# Patient Record
Sex: Male | Born: 1940 | Race: White | Hispanic: No | Marital: Married | Smoking: Former smoker
Health system: Southern US, Community
[De-identification: ages and names within clinical notes are randomized; demographics above are authoritative.]

## PROBLEM LIST (undated history)

## (undated) DIAGNOSIS — I6529 Occlusion and stenosis of unspecified carotid artery: Secondary | ICD-10-CM

## (undated) DIAGNOSIS — Z86010 Personal history of colonic polyps: Secondary | ICD-10-CM

## (undated) DIAGNOSIS — J8410 Pulmonary fibrosis, unspecified: Secondary | ICD-10-CM

## (undated) DIAGNOSIS — R0602 Shortness of breath: Secondary | ICD-10-CM

## (undated) DIAGNOSIS — I771 Stricture of artery: Secondary | ICD-10-CM

## (undated) DIAGNOSIS — E785 Hyperlipidemia, unspecified: Secondary | ICD-10-CM

## (undated) DIAGNOSIS — M1990 Unspecified osteoarthritis, unspecified site: Secondary | ICD-10-CM

## (undated) DIAGNOSIS — Z860101 Personal history of adenomatous and serrated colon polyps: Secondary | ICD-10-CM

## (undated) DIAGNOSIS — I209 Angina pectoris, unspecified: Secondary | ICD-10-CM

## (undated) DIAGNOSIS — I10 Essential (primary) hypertension: Secondary | ICD-10-CM

## (undated) DIAGNOSIS — M489 Spondylopathy, unspecified: Secondary | ICD-10-CM

## (undated) DIAGNOSIS — F17200 Nicotine dependence, unspecified, uncomplicated: Secondary | ICD-10-CM

## (undated) DIAGNOSIS — N189 Chronic kidney disease, unspecified: Secondary | ICD-10-CM

## (undated) DIAGNOSIS — I2510 Atherosclerotic heart disease of native coronary artery without angina pectoris: Secondary | ICD-10-CM

## (undated) HISTORY — PX: CERVICAL DISC SURGERY: SHX588

## (undated) HISTORY — PX: APPENDECTOMY: SHX54

## (undated) HISTORY — DX: Personal history of colonic polyps: Z86.010

## (undated) HISTORY — DX: Stricture of artery: I77.1

## (undated) HISTORY — DX: Personal history of adenomatous and serrated colon polyps: Z86.0101

---

## 1968-11-19 HISTORY — PX: CYSTOSCOPY: SUR368

## 2001-11-06 ENCOUNTER — Ambulatory Visit (HOSPITAL_COMMUNITY): Admission: RE | Admit: 2001-11-06 | Discharge: 2001-11-06 | Payer: Self-pay | Admitting: Internal Medicine

## 2001-11-06 ENCOUNTER — Encounter: Payer: Self-pay | Admitting: Internal Medicine

## 2001-12-25 ENCOUNTER — Encounter: Payer: Self-pay | Admitting: Neurosurgery

## 2001-12-26 ENCOUNTER — Inpatient Hospital Stay (HOSPITAL_COMMUNITY): Admission: RE | Admit: 2001-12-26 | Discharge: 2001-12-27 | Payer: Self-pay | Admitting: Neurosurgery

## 2001-12-26 ENCOUNTER — Encounter: Payer: Self-pay | Admitting: Neurosurgery

## 2003-10-20 ENCOUNTER — Ambulatory Visit (HOSPITAL_COMMUNITY): Admission: RE | Admit: 2003-10-20 | Discharge: 2003-10-20 | Payer: Self-pay | Admitting: Internal Medicine

## 2003-10-20 HISTORY — PX: COLONOSCOPY: SHX174

## 2004-06-17 ENCOUNTER — Ambulatory Visit: Payer: Self-pay | Admitting: Orthopedic Surgery

## 2004-07-15 ENCOUNTER — Ambulatory Visit: Payer: Self-pay | Admitting: Orthopedic Surgery

## 2007-01-15 ENCOUNTER — Encounter: Payer: Self-pay | Admitting: Internal Medicine

## 2007-01-15 ENCOUNTER — Ambulatory Visit: Payer: Self-pay | Admitting: Internal Medicine

## 2007-01-15 ENCOUNTER — Ambulatory Visit (HOSPITAL_COMMUNITY): Admission: RE | Admit: 2007-01-15 | Discharge: 2007-01-15 | Payer: Self-pay | Admitting: Internal Medicine

## 2007-01-15 HISTORY — PX: COLONOSCOPY: SHX174

## 2007-10-16 DIAGNOSIS — Z860100 Personal history of colon polyps, unspecified: Secondary | ICD-10-CM | POA: Insufficient documentation

## 2010-08-03 NOTE — Op Note (Signed)
NAME:  Russell Patton, Russell Patton               ACCOUNT NO.:  [id number]   MEDICAL RECORD NO.:  [id number]          PATIENT TYPE:  AMB   LOCATION:  DAY                           FACILITY:  [facility]   PHYSICIAN:  R. Roetta Sessions, M.D. DATE OF BIRTH:  09/25/1940   DATE OF PROCEDURE:  01/15/2007  DATE OF DISCHARGE:                               OPERATIVE REPORT   INDICATIONS FOR PROCEDURE:  A 70 year old gentleman with a history  colonic adenomas.  Positive family history of colon cancer in a first  relative (father).  Last colonoscopy was 3 years ago at which time he  had polyp removed.  He is devoid of any lower GI tract symptoms.  Colonoscopy is now being done as a surveillance maneuver.  This approach  has discussed with the patient at length.  Potential risks, benefits,  and alternatives have been reviewed.  Please see the documentation in  the medical record.   PROCEDURE NOTE:  O2 saturation, blood pressure, pulse, and respirations  monitored throughout the entirety of the procedure.   CONSCIOUS SEDATION:  Versed 3 mg IV, Demerol 75 mg IV in divided doses.   INSTRUMENT:  Pentax video chip system.   FINDINGS:  Digital rectal exam revealed no abnormalities.   ENDOSCOPIC FINDINGS:  The prep was adequate.   COLON:  The colonic mucosa was surveyed from the rectosigmoid junction  through the left transverse and right colon to the appendiceal orifice,  ileocecal valve, and cecum.  These structures were well seen and  photographed for the record.   From this level scope was withdrawn, slowly and cautiously, all  previously mentioned mucosal surfaces were, again, seen taking taken  care to review the mucosa carefully with a combination of tip-flexion  and fold-flattening.  The patient had a pan colonic diverticula and a 5  mm polyp on a stalk in the base the cecum which was cold snared.  There  was an adjacent diminutive polyp which was cold biopsied/removed.  In  the mid descending colon there  was a second 5-mm pedunculated polyp  which was removed via cold snare technique.  The remainder of the  colonic mucosa appeared normal.   Scope was pulled down to the rectum.  A thorough examination of the  rectal mucosa including retroflex view of the anal verge demonstrated no  abnormalities.  The patient tolerated the procedure well, and was  reacted in endoscopy.   IMPRESSION:  1. Normal rectum.  2. Pan colonic diverticula.  3. Cecal and descending colon polyps removed as described above.   RECOMMENDATIONS:  1. Diverticulosis literature provided [name].  2. Follow up on path.  3. Further recommendations to follow.      Jonathon Bellows, M.D.  Electronically Signed     RMR/MEDQ  D:  01/15/2007  T:  01/15/2007  Job:  [id number]   cc:   Madelin Rear. Sherwood Gambler, MD  Fax: [phone]

## 2010-08-06 NOTE — Op Note (Signed)
NAME:  Russell Patton, PROUT NO.:  [id number]   MEDICAL RECORD NO.:  [id number]                   PATIENT TYPE:  INP   LOCATION:  3009                                 FACILITY:  [facility]   PHYSICIAN:  Clydene Fake, M.D.               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   POSTOPERATIVE DIAGNOSES:  Spondylosis, left foraminal stenosis  C5-C6 and C6-  C7 with left-sided radiculopathy.   PROCEDURE:  Anterior cervical decompression diskectomy and fusion at C5-C6  and C6-C7 with allograft and allograft and tethered anterior cervical plate.   ANESTHESIA:  General endotracheal tube was performed.   DRAINS:  None.   COMPLICATIONS:  None.   SURGEON:  Clydene Fake, M.D.   ASSISTANT:  Payton Doughty, M.D.   REASON FOR PROCEDURE:  The patient is a 70 year old gentleman who for 2-3  months had considerable neck and left arm pain in the C7 distribution with  some numbness in the C6, C7, and C8 distributions and also decreased reflex  in the left biceps and triceps on the left compared to the right side.  There was severe spondylosis at C5-C6 and C6-C7 and left-sided foraminal  compression and narrowing on the left side.  The patient was tried on anti-  inflammatories and did not have improvement and consented for decompression  and fusion.   PROCEDURE IN DETAIL:  The patient was brought into the operating room.  General anesthesia was induced.  The patient was placed in Holter traction  with 10 pounds, prepped and draped in the usual sterile fashion.  Incision  was injected with 10 cc of 1% lidocaine with epinephrine.  Incision was then  made from the midline to the anterior border of the sternocleidomastoid  muscle, and the left lateral neck  incision taken down to the platysma where  hemostasis was obtained with Bovie cauterization.  The Bovie was used to  incise the platysma, and blunt dissection was used to dissect the anterior  cervical fascia to the anterior cervical spine, where a needle was placed in  the interspace.  X-rays were obtained confirming the C5-C6 interspace.  Disk  space was incised.  The needle was removed and partial diskectomy was  performed.  The longus colli muscle was then reflected laterally on each  side using the Bovie.  A self-retaining retractor system was placed at the  C5-C6 and C6-C7 interspaces.  Distraction pins were placed into C5 and C7.  The disk spaces at C5-C6 and C6-C7 with the Ginsu blade, and then the  interspace was distracted.  At this point, the microscope was brought in for  microdissection.  Diskectomy was performed using pituitary rongeurs,  curettes,  and the high-speed drill.  The decompression of central cord and  foramen was continued by removing the posterior longitudinal ligaments and  large osteophytes and performing again bilateral foraminotomies again using  Kerrison punches and the high-speed drill.  First, we finished this at C5-C6  and did extensive decompression out laterally on the left side to adequately  decompress the roots.  Hemostasis was obtained with Gelfoam and Thrombin,  after the depth of the vertebral body was measured with department gauge.  The decompression was repeated at the C6-C7 level again using curettes and  Kerrison punches.  Again, we had to decompress central cord and then  bilateral nerve roots on the left side where we did a fairly lateral  decompression to be able to decompress the roots.  Using the high-speed  drill, we removed the cartilaginous end plate down to bleeding bone.  A  ________ 7-mm allograft bone was hydrated.  The bone was already shorter  than the depth of the vertebral body, and so it was tapped into the  interspace and  countersunk 1-2 mm.  There was plenty of room between the  back of the bone and the dura as checked with a bone nerve hook.  This was  first done at C5-C6 and then repeated at C6-C7.  The distractor was removed.  Distraction pins were removed, and hemostasis at the bone edges was obtained  with Gelfoam and thrombin.  A tethered anterior  cervical plate was then  placed over the anterior cervical spine and two screws placed into C5, two  into C7, and one into C6.  X-rays were obtained showing good position of  plates, screws, and bone grafts.  The wound was irrigated with antibiotic  solution.  Hemostasis was obtained with bipolar cauterization, Gelfoam, and  Thrombin.  We did have some bleeding.  It did take some time to stop the  bleeding, and we found a couple of small bleeders that were bipolared, and  with Gelfoam and Thrombin in the interbody space of C5-C6, we were able to  slow down the bleeding.  Once that was done, we did irrigate the Gelfoam out  to make sure there was no cord compression.  The wound was then irrigated,  and there was clear fluid.  We were sure we had good hemostasis.  We then  laid some Surgicel over the longus colli muscle edges, and then closed the  platysma with 3-0 Vicryl interrupted sutures, closed the subcutaneous tissue  with the same, and closed the skin with Benzoin and Steri-Strips.  Dressing  was placed.  The patient was placed in a soft sterile collar, awoken from  anesthesia, and transferred to the recovery room in stable condition.                                                Clydene Fake, M.D.    JRH/MEDQ  D:  12/26/2001  T:  12/27/2001  Job:  [id number]

## 2010-08-06 NOTE — Op Note (Signed)
NAME:  Russell Patton, Russell Patton                         ACCOUNT NO.:  [id number]   MEDICAL RECORD NO.:  [id number]                   PATIENT TYPE:  AMB   LOCATION:  DAY                                  FACILITY:  [facility]   PHYSICIAN:  R. Roetta Sessions, M.D.              DATE OF BIRTH:  1940/12/28   DATE OF PROCEDURE:  10/20/2003  DATE OF DISCHARGE:                                 OPERATIVE REPORT   PROCEDURE:  Colonoscopy with snare polypectomy.   INDICATIONS FOR PROCEDURE:  The patient is a 70 year old gentleman referred  by Dr. Sherwood Gambler for colorectal cancer screening.  Family history is significant  in his mother who succumbed to colorectal cancer at age 71.  He has never  had his lower GI tract imaged.  Colonoscopy is now being done.  This  approach has been discussed with the patient at length.  Potential risks and  benefits have been reviewed.  Please see my handwritten H&P for more  information.   PROCEDURE NOTE:  O2 saturation, blood pressure, pulse and respirations were  monitored and satisfactory throughout the procedure.   CONSCIOUS SEDATION:  Versed 4 mg IV, Demerol 75 mg IV  in divided doses.   INSTRUMENT:  Olympus video chip system.   FINDINGS:  Digital rectal examination revealed no abnormality.   ENDOSCOPIC FINDINGS:  Prep was adequate.  Rectum:  Examination of the rectal  mucosa was clear.  Retroflexion of the anal verge revealed no abnormalities.  Colon:  The colonic mucosa was surveyed from the rectosigmoid junction  through a left, transverse, right colon to the area of the appendiceal  orifice and ileocecal valve and cecum.  From this level, the scope was  slowly withdrawn.  All previously mentioned mucosal surfaces were again  seen.  The patient was noted to have sigmoid diverticula and two 0.75 cm  polyps, one opposite the ileocecal valve and one at the splenic flexure  which were cold snared and recovered.   The remainder of the colonic mucosa appeared normal.  The  patient tolerated  the procedure well, was reactive at endoscopy.   IMPRESSION:  1. Normal rectum.  2. Sigmoid diverticula.  3. Polyps at splenic flexure and at the cecum, (opposite ileocecal valve),     removed with a snare.   RECOMMENDATIONS:  1. No aspirin or arthritis medications for the next 10 days.  2. Follow up on pathology.  3. Diverticulosis literature given to Mr. Macleod.  4. Further recommendations to follow.      ___________________________________________                                            Jonathon Bellows, M.D.   RMR/MEDQ  D:  10/20/2003  T:  10/20/2003  Job:  [id number]   cc:   Lyman Bishop  J. Sherwood Gambler, M.D.  [id number]  [place]  [place] [address]  Fax: [phone]

## 2011-01-27 ENCOUNTER — Other Ambulatory Visit: Payer: Self-pay

## 2011-01-27 ENCOUNTER — Encounter (HOSPITAL_COMMUNITY): Payer: Self-pay | Admitting: Emergency Medicine

## 2011-01-27 ENCOUNTER — Inpatient Hospital Stay (HOSPITAL_COMMUNITY)
Admission: EM | Admit: 2011-01-27 | Discharge: 2011-02-03 | DRG: 233 | Disposition: A | Discharge status: Home / Self Care | Payer: Medicare Other | Source: Ambulatory Visit | Attending: Cardiothoracic Surgery | Admitting: Cardiothoracic Surgery

## 2011-01-27 DIAGNOSIS — Z87891 Personal history of nicotine dependence: Secondary | ICD-10-CM

## 2011-01-27 DIAGNOSIS — I498 Other specified cardiac arrhythmias: Secondary | ICD-10-CM | POA: Diagnosis present

## 2011-01-27 DIAGNOSIS — D649 Anemia, unspecified: Secondary | ICD-10-CM | POA: Diagnosis not present

## 2011-01-27 DIAGNOSIS — R0602 Shortness of breath: Secondary | ICD-10-CM

## 2011-01-27 DIAGNOSIS — I658 Occlusion and stenosis of other precerebral arteries: Secondary | ICD-10-CM | POA: Diagnosis present

## 2011-01-27 DIAGNOSIS — I10 Essential (primary) hypertension: Secondary | ICD-10-CM | POA: Diagnosis present

## 2011-01-27 DIAGNOSIS — Z951 Presence of aortocoronary bypass graft: Secondary | ICD-10-CM

## 2011-01-27 DIAGNOSIS — E8779 Other fluid overload: Secondary | ICD-10-CM | POA: Diagnosis not present

## 2011-01-27 DIAGNOSIS — E785 Hyperlipidemia, unspecified: Secondary | ICD-10-CM | POA: Diagnosis present

## 2011-01-27 DIAGNOSIS — I2510 Atherosclerotic heart disease of native coronary artery without angina pectoris: Principal | ICD-10-CM | POA: Diagnosis present

## 2011-01-27 DIAGNOSIS — D72829 Elevated white blood cell count, unspecified: Secondary | ICD-10-CM | POA: Diagnosis not present

## 2011-01-27 DIAGNOSIS — R001 Bradycardia, unspecified: Secondary | ICD-10-CM | POA: Diagnosis not present

## 2011-01-27 DIAGNOSIS — J180 Bronchopneumonia, unspecified organism: Secondary | ICD-10-CM | POA: Diagnosis not present

## 2011-01-27 DIAGNOSIS — Z8249 Family history of ischemic heart disease and other diseases of the circulatory system: Secondary | ICD-10-CM

## 2011-01-27 DIAGNOSIS — I6529 Occlusion and stenosis of unspecified carotid artery: Secondary | ICD-10-CM | POA: Diagnosis present

## 2011-01-27 DIAGNOSIS — Z7982 Long term (current) use of aspirin: Secondary | ICD-10-CM

## 2011-01-27 DIAGNOSIS — I200 Unstable angina: Secondary | ICD-10-CM | POA: Diagnosis present

## 2011-01-27 DIAGNOSIS — M489 Spondylopathy, unspecified: Secondary | ICD-10-CM

## 2011-01-27 HISTORY — DX: Spondylopathy, unspecified: M48.9

## 2011-01-27 HISTORY — DX: Atherosclerotic heart disease of native coronary artery without angina pectoris: I25.10

## 2011-01-27 HISTORY — DX: Unspecified osteoarthritis, unspecified site: M19.90

## 2011-01-27 HISTORY — DX: Occlusion and stenosis of unspecified carotid artery: I65.29

## 2011-01-27 HISTORY — DX: Essential (primary) hypertension: I10

## 2011-01-27 HISTORY — DX: Nicotine dependence, unspecified, uncomplicated: F17.200

## 2011-01-27 HISTORY — DX: Hyperlipidemia, unspecified: E78.5

## 2011-01-27 LAB — BASIC METABOLIC PANEL
BUN: 14 mg/dL (ref 6–23)
CO2: 24 mEq/L (ref 19–32)
Calcium: 8.9 mg/dL (ref 8.4–10.5)
Creatinine, Ser: 0.99 mg/dL (ref 0.50–1.35)
GFR calc non Af Amer: 80 mL/min — ABNORMAL LOW (ref >90)
Glucose, Bld: 102 mg/dL — ABNORMAL HIGH (ref 70–99)
Sodium: 135 mEq/L (ref 135–145)

## 2011-01-27 LAB — DIFFERENTIAL
Eosinophils Absolute: 0.1 10*3/uL (ref 0.0–0.7)
Eosinophils Relative: 1 % (ref 0–5)
Lymphs Abs: 1.7 10*3/uL (ref 0.7–4.0)
Monocytes Absolute: 0.6 10*3/uL (ref 0.1–1.0)
Monocytes Relative: 6 % (ref 3–12)

## 2011-01-27 LAB — CBC
HCT: 43.9 % (ref 39.0–52.0)
MCH: 32 pg (ref 26.0–34.0)
MCV: 90.1 fL (ref 78.0–100.0)
Platelets: 186 10*3/uL (ref 150–400)
RBC: 4.87 MIL/uL (ref 4.22–5.81)

## 2011-01-27 MED ORDER — ASPIRIN 81 MG PO CHEW
324.0000 mg | CHEWABLE_TABLET | Freq: Once | ORAL | Status: AC
  Administered 2011-01-27: 324 mg via ORAL
  Filled 2011-01-27: qty 4

## 2011-01-27 MED ORDER — HEPARIN BOLUS VIA INFUSION
4000.0000 [IU] | Freq: Once | INTRAVENOUS | Status: AC
  Administered 2011-01-27: 4000 [IU] via INTRAVENOUS
  Filled 2011-01-27: qty 4000

## 2011-01-27 MED ORDER — SODIUM CHLORIDE 0.9 % IV SOLN: INTRAVENOUS | Status: DC

## 2011-01-27 MED ORDER — HEPARIN (PORCINE) IN NACL 100-0.45 UNIT/ML-% IJ SOLN
1400.0000 [IU]/h | INTRAMUSCULAR | Status: DC
  Administered 2011-01-27: 1000 [IU]/h via INTRAVENOUS
  Administered 2011-01-28: 1400 [IU]/h via INTRAVENOUS
  Filled 2011-01-27 (×3): qty 250

## 2011-01-27 NOTE — ED Notes (Signed)
Pt. Having an exercise stress test and developed ECg changes and sob.  Stopped the exercise test and began the chemical stress test and pt. Developed hypotension and bradycardia from the medication.  That test also was stopped and pt. Sent to [place] to be cathed this afternoon.   Pt. Denies any chest pain or sob.  Skin is p/w/d. Resp. E/u

## 2011-01-27 NOTE — ED Notes (Signed)
Pt. oob to bathroom gait steady denies any sob or chest pain. Wife at bedside

## 2011-01-27 NOTE — H&P (Signed)
Russell Patton is an 70 y.o. male.   Chief Complaint: Patient stated he felt short of breath with stress test and really bad with Lexi scan Myoview. HPI: 70 year old white, married male was sent to the emergency room from [facility] where he was attempting to undergo exercise stress test. During the stress portion patient became significantly dyspneic with any exertion. Dr. Rennis Golden was present for the test, which was changed to Lexi scan myoview. While undergoing Lexi scan myoview, patient developed significant bradycardia in the 30s and was symptomatic with hypotension and near syncope. Additionally patient had significant ST depression on the EKG portion of the stress test in leads 1, 2, 3, aVF, V4, V5, and V6. He had ST elevation in lead aVR and V1. EKG changes resolved in recovery. The nuclear portion of the study did not reveal ischemia. Dr. Blanchie Dessert concern is that patient has balanced ischemia and needs cardiac catheterization to determine reason for the EKG changes and that severe symptoms he had with a stress tests.  Here in the emergency room the patient has no pain, no shortness of breath, and only occasional PVCs though some are paired and on the monitor. Dr. Blanchie Dessert plan is to admit the patient plan proceed with cardiac catheterization 01/28/2011.  Patient was originally seen by Dr. Rennis Golden 01/11/2011 for evaluation concerning unequal blood pressures and left and right arms as well as dyslipidemia. Additionally patient had a 2-D echo which was normal and carotid Dopplers which demonstrated a severe occlusion with increased velocity on the right and a mild occlusion on the flow velocities on the left.   Past Medical History  Diagnosis Date  . Hypertension   . Carotid artery stenosis   . Hyperlipidemia   . Coronary artery disease   . Cervical spine disease 01/27/2011    Past Surgical History  Procedure Date  . Appendectomy   . Cervical disc surgery     Family  History  Problem Relation Age of Onset  . Cancer Mother   . Heart attack Father    Social History:  reports that he quit smoking about 9 years ago. His smoking use included Cigarettes. He does not have any smokeless tobacco history on file. He reports that he drinks alcohol. He reports that he does not use illicit drugs.  Allergies: No Known Allergies  Medications Prior to Admission  Medication Dose Route Frequency Provider Last Rate Last Dose  . aspirin chewable tablet 324 mg  324 mg Oral Once Otilio Miu, PA   324 mg at 01/27/11 1114   No current outpatient prescriptions on file as of 01/27/2011.    Results for orders placed during the hospital encounter of 01/27/11 (from the past 48 hour(s))  CBC     Status: Normal   Collection Time   01/27/11 10:31 AM      Component Value Range Comment   WBC 10.4  4.0 - 10.5 (K/uL)    RBC 4.87  4.22 - 5.81 (MIL/uL)    Hemoglobin 15.6  13.0 - 17.0 (g/dL)    HCT 40.9  81.1 - 91.4 (%)    MCV 90.1  78.0 - 100.0 (fL)    MCH 32.0  26.0 - 34.0 (pg)    MCHC 35.5  30.0 - 36.0 (g/dL)    RDW 78.2  95.6 - 21.3 (%)    Platelets 186  150 - 400 (K/uL)   DIFFERENTIAL     Status: Abnormal   Collection Time   01/27/11 10:31  AM      Component Value Range Comment   Neutrophils Relative 77  43 - 77 (%)    Neutro Abs 8.1 (*) 1.7 - 7.7 (K/uL)    Lymphocytes Relative 16  12 - 46 (%)    Lymphs Abs 1.7  0.7 - 4.0 (K/uL)    Monocytes Relative 6  3 - 12 (%)    Monocytes Absolute 0.6  0.1 - 1.0 (K/uL)    Eosinophils Relative 1  0 - 5 (%)    Eosinophils Absolute 0.1  0.0 - 0.7 (K/uL)    Basophils Relative 0  0 - 1 (%)    Basophils Absolute 0.0  0.0 - 0.1 (K/uL)   BASIC METABOLIC PANEL     Status: Abnormal   Collection Time   01/27/11 10:31 AM      Component Value Range Comment   Sodium 135  135 - 145 (mEq/L)    Potassium 4.1  3.5 - 5.1 (mEq/L)    Chloride 103  96 - 112 (mEq/L)    CO2 24  19 - 32 (mEq/L)    Glucose, Bld 102 (*) 70 - 99 (mg/dL)    BUN  14  6 - 23 (mg/dL)    Creatinine, Ser 1.61  0.50 - 1.35 (mg/dL)    Calcium 8.9  8.4 - 10.5 (mg/dL)    GFR calc non Af Amer 80 (*) >90 (mL/min)    GFR calc Af Amer >90  >90 (mL/min)   TROPONIN I     Status: Normal   Collection Time   01/27/11 10:35 AM      Component Value Range Comment   Troponin I <0.30  <0.30 (ng/mL)   CK TOTAL AND CKMB     Status: Normal   Collection Time   01/27/11 10:35 AM      Component Value Range Comment   Total CK 81  7 - 232 (U/L)    CK, MB 2.6  0.3 - 4.0 (ng/mL)    Relative Index RELATIVE INDEX IS INVALID  0.0 - 2.5     No results found.  Review of Systems  Constitutional: Negative for fever, chills and weight loss.  HENT: Positive for neck pain. Negative for congestion.   Eyes: Negative for blurred vision.  Respiratory: Positive for shortness of breath.   Cardiovascular: Negative for chest pain.  Gastrointestinal: Negative for heartburn, nausea, vomiting, diarrhea, constipation and blood in stool.  Genitourinary: Negative for dysuria and urgency.  Musculoskeletal: Negative for myalgias.  Skin: Negative for rash.  Neurological: Negative for dizziness, focal weakness, loss of consciousness and weakness.  Endo/Heme/Allergies: Negative for polydipsia. Does not bruise/bleed easily.  Psychiatric/Behavioral: Negative for depression.    Blood pressure 131/75, pulse 74, temperature 98.1 F (36.7 C), temperature source Oral, resp. rate 16, SpO2 95.00%. Physical Exam General: Alert oriented white male, no acute distress. Pleasant affect. Skin: Warm and dry, brisk capillary refill. HEENT: Normocephalic, sclera clear. Neck: Supple, no JVD, no bruits detected. Lungs: Clear without rales, rhonchi or wheezes. Heart: S1-S2,regular, rate and rhythm without murmur ,gallop, rub or click. Abdomen: Soft nontender positive bowel sounds do not palpate liver spleen or masses. Extremities:2+ pedal pulses bilaterally ,no lower extremity edema, 2+ radial pulses  bilaterally. Neuro: Alert oriented x3, moves all extremities, follows commands.  Assessment/Plan Patient Active Problem List  Diagnoses  . COLONIC POLYPS, HX OF  . DOE (dyspnea on exertion)  . Bradycardia, severe sinus  . HTN (hypertension)  . Dyslipidemia, goal LDL below 100  .  Cervical spine disease   Plan: Dr. Rennis Golden saw the patient in the office and did complete exam. We are admitting to telemetry bed and for monitoring overnight. Our plan will be to do a heart catheterization with Dr. Tresa Endo 01/28/2011. We will do serial CK-MBs and troponin in the meantime. We will begin IV heparin. We will also have nitroglycerin sublingual if patient needs. We'll begin a very small dose of beta blocker for now. Patient will be n.p.o. After midnight for heart catheterization.      Loriel Diehl R 01/27/2011, 12:38 PM

## 2011-01-27 NOTE — ED Provider Notes (Signed)
Medical screening examination/treatment/procedure(s) were performed by non-physician practitioner and as supervising physician I was immediately available for consultation/collaboration.  Raeford Razor, MD 01/27/11 1730

## 2011-01-27 NOTE — Progress Notes (Signed)
ANTICOAGULATION CONSULT NOTE - Initial Consult  Pharmacy Consult for Heparin Indication: chest pain/ACS  No Known Allergies  Patient Measurements: Height: 5\' 9"  (175.3 cm) Weight: 205 lb (92.987 kg) IBW/kg (Calculated) : 70.7  Adjusted Body Weight: 77.4  Vital Signs: Temp: 98.1 F (36.7 C) (11/08 1002) Temp src: Oral (11/08 1002) BP: 137/81 mmHg (11/08 1501) Pulse Rate: 70  (11/08 1501)  Labs:  Basename 01/27/11 1035 01/27/11 1031  HGB -- 15.6  HCT -- 43.9  PLT -- 186  APTT -- --  LABPROT -- --  INR -- --  HEPARINUNFRC -- --  CREATININE -- 0.99  CKTOTAL 81 --  CKMB 2.6 --  TROPONINI <0.30 --   Estimated Creatinine Clearance: 77.1 ml/min (by C-G formula based on Cr of 0.99).  Medical History: Past Medical History  Diagnosis Date  . Hypertension   . Carotid artery stenosis   . Hyperlipidemia   . Coronary artery disease   . Cervical spine disease 01/27/2011    Medications:  Amlodipine 10 mg daily Aspirin 81 mg daily MVI daily Atorvastatin 10 mg daily Omega-3 2 gm. daily   Assessment: Admitted with c/o feeling bad after stress test and myoview  Goal of Therapy:  Heparin level 0.3-0.7 units/ml   Plan:  4000  units bolus 1000 units/hr IV infusion Check 6 hour heparin level  Russell Patton 01/27/2011,4:31 PM

## 2011-01-27 NOTE — ED Notes (Signed)
Patient is resting comfortably. 

## 2011-01-27 NOTE — ED Provider Notes (Signed)
History     CSN: [id number] Arrival date & time: 01/27/2011 10:01 AM   None     Chief Complaint  Patient presents with  . Shortness of Breath    ecg changes on the stress this AM    (Consider location/radiation/quality/duration/timing/severity/associated sxs/prior treatment) HPI History provided by pt.   Pt was at [facility] today having an exercise stress echo d/t recent exertional SOB.  Developed SOB w/ associated EKG changes during exam.  Per NP w/ [facility], nuclear stress attempted afterwards and pt became bradycardic and hypotensive.  Transferred to ED for admission and cath.  He is currently asymptomatic.  No known h/o CAD.    Past Medical History  Diagnosis Date  . Hypertension   . Carotid artery stenosis   . Hyperlipidemia   . Coronary artery disease     Past Surgical History  Procedure Date  . Appendectomy   . Cervical disc surgery     No family history on file.  History  Substance Use Topics  . Smoking status: Never Smoker   . Smokeless tobacco: Not on file  . Alcohol Use: Yes      Review of Systems  All other systems reviewed and are negative.    Allergies  Review of patient's allergies indicates no known allergies.  Home Medications   Current Outpatient Rx  Name Route Sig Dispense Refill  . AMLODIPINE BESYLATE 10 MG PO TABS Oral Take 10 mg by mouth every morning.      . ASPIRIN EC 81 MG PO TBEC Oral Take 81 mg by mouth every morning.      . ATORVASTATIN CALCIUM 10 MG PO TABS Oral Take 10 mg by mouth every morning.      Carma Leaven M PLUS PO TABS Oral Take 1 tablet by mouth every morning.      Marland Kitchen OMEGA-3-ACID ETHYL ESTERS 1 G PO CAPS Oral Take 2 g by mouth every morning.        BP 131/75  Pulse 74  Temp(Src) 98.1 F (36.7 C) (Oral)  Resp 16  SpO2 95%  Physical Exam  Nursing note and vitals reviewed. Constitutional: He is oriented to person, place, and time. He appears well-developed and well-nourished. No distress.  HENT:  Head: Normocephalic and  atraumatic.  Eyes:       Normal appearance  Neck: Normal range of motion.  Cardiovascular: Normal rate and regular rhythm.   Pulmonary/Chest: Effort normal and breath sounds normal.  Neurological: He is alert and oriented to person, place, and time.  Skin: Skin is warm and dry. No rash noted.  Psychiatric: He has a normal mood and affect. His behavior is normal.    ED Course  Procedures (including critical care time)  Labs Reviewed  DIFFERENTIAL - Abnormal; Notable for the following:    Neutro Abs 8.1 (*)    All other components within normal limits  BASIC METABOLIC PANEL - Abnormal; Notable for the following:    Glucose, Bld 102 (*)    GFR calc non Af Amer 80 (*)    All other components within normal limits  CBC  TROPONIN I  CK TOTAL AND CKMB   No results found.   1. Shortness of breath       MDM  Pt transferred from [facility] for admission.  Developed SOB and EKG changes during stress echo.  EKGs reviewed.  No prior cardiac history.  Pt has received aspirin.  Troponin neg and labs otherwise unremarkable.  [facility] aware that he  is here.         Otilio Miu, [place] 01/27/11 1256

## 2011-01-27 NOTE — ED Notes (Signed)
Report received from lori berdik rn pt to go to yellow room 18

## 2011-01-27 NOTE — ED Notes (Signed)
REport given to [facility], pt. Transferred to Exam 18

## 2011-01-27 NOTE — ED Notes (Signed)
Family at bedside. 

## 2011-01-27 NOTE — ED Notes (Signed)
Patient resting quietly at this time.  Denies chest pain.

## 2011-01-28 ENCOUNTER — Other Ambulatory Visit: Payer: Self-pay

## 2011-01-28 ENCOUNTER — Encounter (HOSPITAL_COMMUNITY)
Admission: EM | Disposition: A | Discharge status: Home / Self Care | Payer: Self-pay | Source: Ambulatory Visit | Attending: Cardiothoracic Surgery

## 2011-01-28 ENCOUNTER — Encounter (HOSPITAL_COMMUNITY): Payer: Self-pay

## 2011-01-28 ENCOUNTER — Inpatient Hospital Stay (HOSPITAL_COMMUNITY): Payer: Medicare Other

## 2011-01-28 ENCOUNTER — Ambulatory Visit (HOSPITAL_COMMUNITY): Admit: 2011-01-28 | Payer: Self-pay | Admitting: Cardiovascular Disease

## 2011-01-28 ENCOUNTER — Encounter (HOSPITAL_COMMUNITY): Payer: Medicare Other

## 2011-01-28 ENCOUNTER — Encounter (HOSPITAL_COMMUNITY): Payer: Self-pay | Admitting: Anesthesiology

## 2011-01-28 DIAGNOSIS — I2510 Atherosclerotic heart disease of native coronary artery without angina pectoris: Secondary | ICD-10-CM

## 2011-01-28 DIAGNOSIS — Z01810 Encounter for preprocedural cardiovascular examination: Secondary | ICD-10-CM

## 2011-01-28 HISTORY — PX: ABDOMINAL ANGIOGRAM: SHX5499

## 2011-01-28 HISTORY — PX: LEFT HEART CATHETERIZATION WITH CORONARY ANGIOGRAM: SHX5451

## 2011-01-28 LAB — CBC
MCH: 31.5 pg (ref 26.0–34.0)
MCHC: 34.6 g/dL (ref 30.0–36.0)
MCV: 91 fL (ref 78.0–100.0)
Platelets: 223 10*3/uL (ref 150–400)
RDW: 12.7 % (ref 11.5–15.5)

## 2011-01-28 LAB — HEPARIN LEVEL (UNFRACTIONATED)
Heparin Unfractionated: 0.13 IU/mL — ABNORMAL LOW (ref 0.30–0.70)
Heparin Unfractionated: 0.21 IU/mL — ABNORMAL LOW (ref 0.30–0.70)
Heparin Unfractionated: 0.27 IU/mL — ABNORMAL LOW (ref 0.30–0.70)

## 2011-01-28 LAB — COMPREHENSIVE METABOLIC PANEL
Albumin: 3.5 g/dL (ref 3.5–5.2)
Alkaline Phosphatase: 78 U/L (ref 39–117)
BUN: 10 mg/dL (ref 6–23)
Chloride: 107 mEq/L (ref 96–112)
Creatinine, Ser: 0.85 mg/dL (ref 0.50–1.35)
GFR calc Af Amer: >90 mL/min (ref >90)
GFR calc non Af Amer: 86 mL/min — ABNORMAL LOW (ref >90)
Glucose, Bld: 90 mg/dL (ref 70–99)
Potassium: 4 mEq/L (ref 3.5–5.1)
Total Bilirubin: 0.6 mg/dL (ref 0.3–1.2)

## 2011-01-28 LAB — MRSA PCR SCREENING: MRSA by PCR: NEGATIVE

## 2011-01-28 LAB — BASIC METABOLIC PANEL
CO2: 22 mEq/L (ref 19–32)
Chloride: 107 mEq/L (ref 96–112)
Creatinine, Ser: 0.97 mg/dL (ref 0.50–1.35)
Potassium: 4 mEq/L (ref 3.5–5.1)
Sodium: 140 mEq/L (ref 135–145)

## 2011-01-28 LAB — ABO/RH: ABO/RH(D): AB POS

## 2011-01-28 LAB — CARDIAC PANEL(CRET KIN+CKTOT+MB+TROPI)
CK, MB: 3.1 ng/mL (ref 0.3–4.0)
Relative Index: 2.6 — ABNORMAL HIGH (ref 0.0–2.5)
Relative Index: 2.9 — ABNORMAL HIGH (ref 0.0–2.5)
Troponin I: <0.3 ng/mL (ref <0.30)

## 2011-01-28 LAB — URINALYSIS, ROUTINE W REFLEX MICROSCOPIC
Bilirubin Urine: NEGATIVE
Hgb urine dipstick: NEGATIVE
Ketones, ur: 15 mg/dL — AB
Nitrite: NEGATIVE
pH: 6.5 (ref 5.0–8.0)

## 2011-01-28 LAB — APTT: aPTT: 42 seconds — ABNORMAL HIGH (ref 24–37)

## 2011-01-28 LAB — LIPID PANEL
Cholesterol: 168 mg/dL (ref 0–200)
HDL: 53 mg/dL (ref >39)
Total CHOL/HDL Ratio: 3.2 RATIO
Triglycerides: 100 mg/dL (ref <150)
VLDL: 20 mg/dL (ref 0–40)

## 2011-01-28 LAB — POCT I-STAT 3, ART BLOOD GAS (G3+)
Patient temperature: 37
TCO2: 21 mmol/L (ref 0–100)
pCO2 arterial: 34.1 mmHg — ABNORMAL LOW (ref 35.0–45.0)
pH, Arterial: 7.383 (ref 7.350–7.450)

## 2011-01-28 LAB — TYPE AND SCREEN
ABO/RH(D): AB POS
Antibody Screen: NEGATIVE

## 2011-01-28 LAB — HEMOGLOBIN A1C: Mean Plasma Glucose: 108 mg/dL (ref <117)

## 2011-01-28 SURGERY — LEFT HEART CATHETERIZATION WITH CORONARY ANGIOGRAM
Anesthesia: LOCAL

## 2011-01-28 MED ORDER — SODIUM CHLORIDE 0.9 % IJ SOLN: 3.0000 mL | INTRAMUSCULAR | Status: DC | PRN

## 2011-01-28 MED ORDER — EPINEPHRINE HCL 1 MG/ML IJ SOLN
0.5000 ug/min | INTRAVENOUS | Status: DC
  Filled 2011-01-28: qty 4

## 2011-01-28 MED ORDER — HEPARIN (PORCINE) IN NACL 2-0.9 UNIT/ML-% IJ SOLN: INTRAMUSCULAR | Status: DC

## 2011-01-28 MED ORDER — PLASMA-LYTE 148 IV SOLN
INTRAVENOUS | Status: AC
  Administered 2011-01-29: 10:00:00
  Filled 2011-01-28: qty 0.5

## 2011-01-28 MED ORDER — MUPIROCIN 2 % EX OINT
1.0000 "application " | TOPICAL_OINTMENT | Freq: Two times a day (BID) | CUTANEOUS | Status: DC
  Filled 2011-01-28: qty 22

## 2011-01-28 MED ORDER — DEXTROSE 5 % IV SOLN
750.0000 mg | INTRAVENOUS | Status: DC
  Filled 2011-01-28 (×2): qty 750

## 2011-01-28 MED ORDER — METOPROLOL TARTRATE 12.5 MG HALF TABLET
12.5000 mg | ORAL_TABLET | Freq: Once | ORAL | Status: AC
  Administered 2011-01-29: 12.5 mg via ORAL
  Filled 2011-01-28: qty 1

## 2011-01-28 MED ORDER — DIAZEPAM 5 MG PO TABS
5.0000 mg | ORAL_TABLET | ORAL | Status: AC
  Administered 2011-01-28: 5 mg via ORAL
  Filled 2011-01-28: qty 1

## 2011-01-28 MED ORDER — FENTANYL CITRATE 0.05 MG/ML IJ SOLN
INTRAMUSCULAR | Status: AC
  Filled 2011-01-28: qty 2

## 2011-01-28 MED ORDER — METOPROLOL TARTRATE 12.5 MG HALF TABLET
12.5000 mg | ORAL_TABLET | Freq: Three times a day (TID) | ORAL | Status: DC
  Administered 2011-01-28 (×2): 12.5 mg via ORAL
  Filled 2011-01-28: qty 1

## 2011-01-28 MED ORDER — HEPARIN (PORCINE) IN NACL 2-0.9 UNIT/ML-% IJ SOLN
INTRAMUSCULAR | Status: AC
  Filled 2011-01-28: qty 2000

## 2011-01-28 MED ORDER — ALPRAZOLAM 0.25 MG PO TABS
0.2500 mg | ORAL_TABLET | Freq: Two times a day (BID) | ORAL | Status: DC | PRN
  Administered 2011-01-28: 0.25 mg via ORAL
  Filled 2011-01-28: qty 1

## 2011-01-28 MED ORDER — CHLORHEXIDINE GLUCONATE CLOTH 2 % EX PADS: 6.0000 | MEDICATED_PAD | Freq: Every day | CUTANEOUS | Status: DC

## 2011-01-28 MED ORDER — NITROGLYCERIN IN D5W 200-5 MCG/ML-% IV SOLN: 5.0000 ug/min | INTRAVENOUS | Status: DC

## 2011-01-28 MED ORDER — CHLORHEXIDINE GLUCONATE 4 % EX LIQD
60.0000 mL | Freq: Once | CUTANEOUS | Status: DC
  Filled 2011-01-28: qty 118

## 2011-01-28 MED ORDER — SODIUM CHLORIDE 0.9 % IJ SOLN
3.0000 mL | Freq: Two times a day (BID) | INTRAMUSCULAR | Status: DC
  Administered 2011-01-28: 3 mL via INTRAVENOUS

## 2011-01-28 MED ORDER — OMEGA-3-ACID ETHYL ESTERS 1 G PO CAPS
2.0000 g | ORAL_CAPSULE | Freq: Every day | ORAL | Status: DC
  Administered 2011-01-28: 2 g via ORAL
  Filled 2011-01-28 (×3): qty 2

## 2011-01-28 MED ORDER — PANTOPRAZOLE SODIUM 40 MG PO TBEC
40.0000 mg | DELAYED_RELEASE_TABLET | Freq: Every day | ORAL | Status: DC
  Administered 2011-01-28: 40 mg via ORAL
  Filled 2011-01-28: qty 1

## 2011-01-28 MED ORDER — SODIUM CHLORIDE 0.9 % IV SOLN: 250.0000 mL | INTRAVENOUS | Status: DC

## 2011-01-28 MED ORDER — SODIUM CHLORIDE 0.9 % IV SOLN
1.0000 mL/kg/h | INTRAVENOUS | Status: DC
  Administered 2011-01-28: 1 mL/kg/h via INTRAVENOUS

## 2011-01-28 MED ORDER — SODIUM CHLORIDE 0.9 % IV SOLN: INTRAVENOUS | Status: DC

## 2011-01-28 MED ORDER — MAGNESIUM SULFATE 50 % IJ SOLN
40.0000 meq | INTRAMUSCULAR | Status: DC
  Filled 2011-01-28: qty 10

## 2011-01-28 MED ORDER — ASPIRIN 300 MG RE SUPP
300.0000 mg | RECTAL | Status: DC
  Filled 2011-01-28: qty 1

## 2011-01-28 MED ORDER — ROSUVASTATIN CALCIUM 20 MG PO TABS
20.0000 mg | ORAL_TABLET | Freq: Every day | ORAL | Status: DC
  Administered 2011-01-28: 20 mg via ORAL
  Filled 2011-01-28 (×3): qty 1

## 2011-01-28 MED ORDER — ONDANSETRON HCL 4 MG/2ML IJ SOLN: 4.0000 mg | Freq: Four times a day (QID) | INTRAMUSCULAR | Status: DC | PRN

## 2011-01-28 MED ORDER — METOPROLOL TARTRATE 12.5 MG HALF TABLET
12.5000 mg | ORAL_TABLET | Freq: Two times a day (BID) | ORAL | Status: DC
  Administered 2011-01-28 (×2): 12.5 mg via ORAL
  Filled 2011-01-28 (×4): qty 1

## 2011-01-28 MED ORDER — PHENYLEPHRINE HCL 10 MG/ML IJ SOLN
30.0000 ug/min | INTRAVENOUS | Status: DC
  Filled 2011-01-28: qty 2

## 2011-01-28 MED ORDER — DIAZEPAM 5 MG PO TABS: 5.0000 mg | ORAL_TABLET | ORAL | Status: DC

## 2011-01-28 MED ORDER — NITROGLYCERIN IN D5W 200-5 MCG/ML-% IV SOLN
2.0000 ug/min | INTRAVENOUS | Status: DC
  Administered 2011-01-28 – 2011-01-29 (×3): 10 ug/min via INTRAVENOUS
  Filled 2011-01-28 (×2): qty 250

## 2011-01-28 MED ORDER — NITROGLYCERIN IN D5W 200-5 MCG/ML-% IV SOLN
2.0000 ug/min | INTRAVENOUS | Status: DC
  Filled 2011-01-28: qty 250

## 2011-01-28 MED ORDER — CHLORHEXIDINE GLUCONATE 4 % EX LIQD
60.0000 mL | Freq: Once | CUTANEOUS | Status: AC
  Administered 2011-01-29: 4 via TOPICAL
  Filled 2011-01-28 (×2): qty 15

## 2011-01-28 MED ORDER — SODIUM CHLORIDE 0.9 % IV SOLN
INTRAVENOUS | Status: DC
  Filled 2011-01-28: qty 1

## 2011-01-28 MED ORDER — ZOLPIDEM TARTRATE 5 MG PO TABS: 5.0000 mg | ORAL_TABLET | Freq: Every evening | ORAL | Status: DC | PRN

## 2011-01-28 MED ORDER — AMINOCAPROIC ACID 250 MG/ML IV SOLN
INTRAVENOUS | Status: DC
  Filled 2011-01-28 (×2): qty 40

## 2011-01-28 MED ORDER — CHLORHEXIDINE GLUCONATE 4 % EX LIQD
60.0000 mL | Freq: Once | CUTANEOUS | Status: AC
  Administered 2011-01-28: 4 via TOPICAL

## 2011-01-28 MED ORDER — LIDOCAINE HCL (PF) 1 % IJ SOLN
INTRAMUSCULAR | Status: AC
  Filled 2011-01-28: qty 30

## 2011-01-28 MED ORDER — DOPAMINE-DEXTROSE 3.2-5 MG/ML-% IV SOLN
2.0000 ug/kg/min | INTRAVENOUS | Status: DC
  Filled 2011-01-28: qty 250

## 2011-01-28 MED ORDER — POTASSIUM CHLORIDE 2 MEQ/ML IV SOLN
80.0000 meq | INTRAVENOUS | Status: DC
  Filled 2011-01-28: qty 40

## 2011-01-28 MED ORDER — DIAZEPAM 5 MG PO TABS
10.0000 mg | ORAL_TABLET | Freq: Once | ORAL | Status: AC
  Administered 2011-01-29: 10 mg via ORAL
  Filled 2011-01-28: qty 2

## 2011-01-28 MED ORDER — NITROGLYCERIN 0.2 MG/ML ON CALL CATH LAB
INTRAVENOUS | Status: AC
  Filled 2011-01-28: qty 1

## 2011-01-28 MED ORDER — CEFUROXIME SODIUM 1.5 G IJ SOLR
1.5000 g | INTRAMUSCULAR | Status: DC
  Filled 2011-01-28: qty 1.5

## 2011-01-28 MED ORDER — ASPIRIN EC 325 MG PO TBEC
325.0000 mg | DELAYED_RELEASE_TABLET | Freq: Every day | ORAL | Status: DC
  Filled 2011-01-28: qty 1

## 2011-01-28 MED ORDER — AMLODIPINE BESYLATE 10 MG PO TABS
10.0000 mg | ORAL_TABLET | Freq: Every day | ORAL | Status: DC
  Administered 2011-01-28: 10 mg via ORAL
  Filled 2011-01-28 (×3): qty 1

## 2011-01-28 MED ORDER — THERA M PLUS PO TABS
1.0000 | ORAL_TABLET | Freq: Every day | ORAL | Status: DC
  Administered 2011-01-28: 1 via ORAL
  Filled 2011-01-28 (×3): qty 1

## 2011-01-28 MED ORDER — HEPARIN (PORCINE) IN NACL 100-0.45 UNIT/ML-% IJ SOLN: 1650.0000 [IU]/h | INTRAMUSCULAR | Status: AC

## 2011-01-28 MED ORDER — HEPARIN BOLUS VIA INFUSION
3000.0000 [IU] | Freq: Once | INTRAVENOUS | Status: AC
  Administered 2011-01-28: 3000 [IU] via INTRAVENOUS
  Filled 2011-01-28: qty 3000

## 2011-01-28 MED ORDER — NITROGLYCERIN 0.4 MG SL SUBL: 0.4000 mg | SUBLINGUAL_TABLET | SUBLINGUAL | Status: DC | PRN

## 2011-01-28 MED ORDER — MIDAZOLAM HCL 2 MG/2ML IJ SOLN
INTRAMUSCULAR | Status: AC
  Filled 2011-01-28: qty 2

## 2011-01-28 MED ORDER — SODIUM CHLORIDE 0.9 % IV SOLN
INTRAVENOUS | Status: DC
  Administered 2011-01-28: 04:00:00 via INTRAVENOUS

## 2011-01-28 MED ORDER — ASPIRIN 81 MG PO CHEW
324.0000 mg | CHEWABLE_TABLET | ORAL | Status: AC
  Administered 2011-01-28: 324 mg via ORAL
  Filled 2011-01-28: qty 4

## 2011-01-28 MED ORDER — SODIUM CHLORIDE 0.9 % IV SOLN
0.1000 ug/kg/h | INTRAVENOUS | Status: DC
  Filled 2011-01-28 (×2): qty 4

## 2011-01-28 MED ORDER — VANCOMYCIN HCL 1000 MG IV SOLR
1500.0000 mg | INTRAVENOUS | Status: DC
  Filled 2011-01-28: qty 1500

## 2011-01-28 MED ORDER — ACETAMINOPHEN 325 MG PO TABS: 650.0000 mg | ORAL_TABLET | ORAL | Status: DC | PRN

## 2011-01-28 MED ORDER — ASPIRIN 81 MG PO CHEW: 324.0000 mg | CHEWABLE_TABLET | ORAL | Status: DC

## 2011-01-28 NOTE — Progress Notes (Signed)
*  PRELIMINARY RESULTS* Upper extremity dopplers performed. Palmer arch: Right-obliterate with radial compression. Normal with ulnar compression. Left-normal with radial and ulnar compression. Lower Extremities: Palpable pulses.  Russell Patton 01/28/2011, 8:39 PM

## 2011-01-28 NOTE — Progress Notes (Signed)
Pre-surgical PCR nasal screening positive for staph, negative for MRSA. Positive nasal swab orders entered per protocol. Patient not needed to be on isolation for this test. Russell Patton, Breck Coons

## 2011-01-28 NOTE — Op Note (Signed)
Cardiac cath note dictated (#[id number]) CSN [id number] Russell Patton,Russell Patton 01/28/2011 3:19 PM

## 2011-01-28 NOTE — Progress Notes (Signed)
Heparin Protocol  Heparin level = 0.27 goal 0.3-0.7 CABG in AM Heparin to be dced oncall to OR  Plan  Increase heparin to 1650 units/hr Dc heparin on call to OR

## 2011-01-28 NOTE — Cardiovascular Report (Signed)
NAMEMarland Kitchen  KAVAN, DEVAN NO.:  [id number]  MEDICAL RECORD NO.:  [id number]  LOCATION:  2912                         FACILITY:  [facility]  PHYSICIAN:  Nicki Guadalajara, M.D.     DATE OF BIRTH:  [birth year]  DATE OF PROCEDURE: DATE OF DISCHARGE:                           CARDIAC CATHETERIZATION   INDICATIONS:  Mr. Russell Patton is a 70 year old gentleman who has history of hypertension, hyperlipidemia.  On health screening, he also was found to have bilateral carotid artery disease.  The patient recently has developed increasing dyspnea on exertion.  He has denied any chest pain.  He is referred for nuclear perfusion stress testing yesterday which was done at our office.  This was abnormal in that he developed exercise-induced hypotension, ST-segment depression of approximately 3 mm and bradycardia.  Imaging did not show definitive perfusion abnormality but the concern for balanced ischemia was raised. Due to his exercise response, he was admitted to [facility] and heparinized with plans for cardiac catheterizations today.  PROCEDURE:  After premedication with Versed 2 mg plus fentanyl 25 mcg, the patient was prepped and draped in usual fashion.  His right femoral artery was punctured anteriorly and a 5-French sheath was inserted without difficulty.  Diagnostic catheterization was done utilizing 5- [place] Judkins for left and right coronary catheters.  At this point, the right catheter was used and initial attempts at cannulating the subclavian were unsuccessful.  There was evidence for significant calcification in the region and for this reason, the decision was made not to cross this calcified area with the catheter.  As a result, the pigtail catheter was then inserted.  RAO ventriculography was performed. The catheter was then pulled back into the aorta under hemodynamic monitoring.  This pigtail catheter was then used for aortography to visualize the subclavian  system.  Due to the patient's hypertensive history, distal aortography was also performed to make certain he did not have any significant renovascular etiology.  With the demonstration of high-grade left main and very proximal right coronary artery disease, which undoubtedly contributed balanced ischemia on his stress test.  The arterial sheath was sutured in place.  The patient was started on heparin, cardiac surgical consultation was obtained.  The patient had stable hemodynamics and was pain free.  He will be transported from the holding area to the CCU with plans for CABG revascularization surgery to be done either later today or tomorrow.  HEMODYNAMIC DATA:  Central aortic pressure was 117/62.  Left ventricular pressure 117/80, post A wave 17.  ANGIOGRAPHIC DATA:  Left main coronary artery was a moderate-sized vessel that had 90-95% distal stenosis prior to bifurcating into the LAD and circumflex system.  The LAD gave rise to proximal diagonal vessel.  There was a moderate 30- 50% stenoses after septal perforating artery in the proximal LAD segment.  The LAD did extend to the apex and appeared small caliber distally.  The circumflex vessel arose from the left main and there was mild 20% ostial narrowing.  There was segmental 20% narrowing in the obtuse 1 vessel.  The right coronary artery was a large caliber dominant vessel that had 95% very proximal stenosis just after the  takeoff of a SA nodal artery. There was 40-50% mid RCA stenosis.  There was evidence for calcification in the region of the great vessels. On aortic root imaging, the subclavian was large caliber and did not appear to have significant stenoses.  However, the LIMA was not well visualized.  Left ventriculography demonstrated preserved global LV function.  There was suggestion for LVH.  EF is at least 55%.  There does appear to be at least 1+ angiographic mitral regurgitation.  Distal aortography did not  demonstrate any significant renal artery stenosis.  It was tapered to the aorta without stenosis.  Of note, on the injection in the region of the aortic knob, there did appear to be mild dilatation of the descending thoracic aorta.  IMPRESSION: 1. Preserved normal left ventricular function with an ejection     fraction of at least 55% with at least 1+ angiographic mitral     regurgitation. 2. Severe life-threatening coronary artery disease with 90-95% distal     left main stenosis, 30-50% stenoses in the proximal left anterior     descending, 20% narrowings in the obtuse marginal 1 vessel and 95%     very proximal right coronary artery stenosis with 40-50% mid right     coronary artery narrowing.  RECOMMENDATION:  The patient will require CABG revascularization surgery.  Dr. Charlann Boxer was contacted after the procedure.  He has seen the patient.  Arrangements will be made for the patient to undergo CABG tomorrow with Dr. Donata Clay as long as he remains stable today.          ______________________________ Nicki Guadalajara, M.D.     TK/MEDQ  D:  01/28/2011  T:  01/28/2011  Job:  [id number]  cc:   Italy Hilty, MD Madelin Rear. Sherwood Gambler, MD

## 2011-01-28 NOTE — Progress Notes (Signed)
ANTICOAGULATION CONSULT NOTE - Initial Consult  Pharmacy Consult for Heparin Indication: chest pain/ACS  No Known Allergies  Patient Measurements: Height: 5\' 9"  (175.3 cm) Weight: 207 lb 14.3 oz (94.3 kg) IBW/kg (Calculated) : 70.7  Adjusted Body Weight: 77.4  Vital Signs: Temp: 97.8 F (36.6 C) (11/09 0555) Temp src: Oral (11/09 0555) BP: 115/68 mmHg (11/09 0555) Pulse Rate: 65  (11/09 0555)  Labs:  Basename 01/28/11 0530 01/27/11 2353 01/27/11 1035 01/27/11 1031  HGB 16.8 -- -- 15.6  HCT 48.6 -- -- 43.9  PLT 223 -- -- 186  APTT 42* -- -- --  LABPROT 13.5 -- -- --  INR 1.01 -- -- --  HEPARINUNFRC -- 0.13* -- --  CREATININE -- -- -- 0.99  CKTOTAL -- -- 81 --  CKMB -- -- 2.6 --  TROPONINI -- -- <0.30 --   Estimated Creatinine Clearance: 77.5 ml/min (by C-G formula based on Cr of 0.99).  Medical History: Past Medical History  Diagnosis Date  . Hypertension   . Carotid artery stenosis   . Hyperlipidemia   . Coronary artery disease   . 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 Arthritis     Medications:  Amlodipine 10 mg daily Aspirin 81 mg daily MVI daily Atorvastatin 10 mg daily Omega-3 2 gm. daily   Assessment: subtherapeutic heparin level on current rate of 1000 units/hr   Goal of Therapy:  Heparin level 0.3-0.7 units/ml   Plan:  rebolus 3000 unitsx1 and increase to  1400 units/hr IV infusion Check 6 hour heparin level  Janice Coffin 01/28/2011,6:46 AM

## 2011-01-28 NOTE — Progress Notes (Signed)
Subjective: No complaints, except DOE.  Objective: Vital signs in last 24 hours: Temp:  [97.8 F (36.6 C)-98.6 F (37 C)] 97.8 F (36.6 C) (11/09 0555) Pulse Rate:  [63-80] 65  (11/09 0555) Resp:  [13-20] 18  (11/09 0555) BP: (115-170)/(59-85) 115/68 mmHg (11/09 0555) SpO2:  [92 %-96 %] 95 % (11/09 0555) Weight:  [92.987 kg (205 lb)-94.3 kg (207 lb 14.3 oz)] 207 lb 14.3 oz (94.3 kg) (11/09 0011) Weight change:  Last BM Date: 01/26/11 Intake/Output from previous day:   Intake/Output this shift:    PE: GENERAL: Pleasant affect, though he was unhappy about not having a bed until 0100, over 12 hours in the ER.  HEART: S1,S2, RRR, no obvious murmur. LUNGS: Clear without rales, rhonchi, or wheezes. ZOX:WRUE, nontender, pos bowel sounds. EXT: No edema.   Lab Results:  Basename 01/28/11 0530 01/27/11 1031  WBC 10.0 10.4  HGB 16.8 15.6  HCT 48.6 43.9  PLT 223 186   BMET  Basename 01/28/11 0530 01/27/11 1031  NA 140 135  K 4.0 4.1  CL 107 103  CO2 22 24  GLUCOSE 112* 102*  BUN 12 14  CREATININE 0.97 0.99  CALCIUM 9.3 8.9    Basename 01/28/11 0825 01/27/11 1035  TROPONINI <0.30 <0.30    Lab Results  Component Value Date   CHOL 168 01/28/2011   HDL 53 01/28/2011   LDLCALC 95 01/28/2011   TRIG 100 01/28/2011   CHOLHDL 3.2 01/28/2011   Lab Results  Component Value Date   HGBA1C 5.4 01/28/2011     Lab Results  Component Value Date   TSH 3.164 01/28/2011    Hepatic Function Panel No results found for this basename: PROT,ALBUMIN,AST,ALT,ALKPHOS,BILITOT,BILIDIR,IBILI in the last 72 hours  Basename 01/28/11 0530  CHOL 168   No results found for this basename: PROTIME in the last 72 hours    EKG: Orders placed during the hospital encounter of 01/27/11  . EKG 12-LEAD  . EKG 12-LEAD  . EKG 12-LEAD    Studies/Results: No results found.  Medications: I have reviewed the patient's current medications.  Assessment/Plan: Patient Active Problem List  Diagnoses    . COLONIC POLYPS, HX OF  . DOE (dyspnea on exertion)  . Bradycardia, severe sinus  . HTN (hypertension)  . Dyslipidemia, goal LDL below 100  . Cervical spine disease   PLAN:  Pt. With abnormal stress test with severe SOB yest. And with Lexi scan myoview severe bradycardia and hypotension.  Concerning for balanced ischemia and possible false neg. Nuclear stress test.  For cardiac cath today. Negative for MI, negative cardiac enzymes.    LOS: 1 day   INGOLD,LAURA R 01/28/2011, 12:44 PMPatient seen and examined. Agree with assessment and plan. KELLY,Issak A 01/28/2011 1:48 PM

## 2011-01-28 NOTE — Progress Notes (Signed)
                   [address]            [place] [address]          [phone]      70 y/o male with DOE and positive stress test. Cath today w/ .90 L main,.95 prox RCA. Plan CABG in am. Procedure,benefits and risks d/w patient. He agrees to proceede.

## 2011-01-28 NOTE — Plan of Care (Signed)
Problem: Phase I Progression Outcomes Goal: Point person for discharge identified Outcome: Completed/Met Date Met:  01/28/11 wife

## 2011-01-28 NOTE — Consults (Signed)
[address]            [place] [address]          [phone]       Russell Patton [facility] Medical Record #[id number] Date of Birth: 1940-10-13  Referring: No ref. provider found Primary Care: No primary provider on file.  Chief Complaint:    Chief Complaint  Patient presents with  . 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  He describes a 2-3 year history of exertional shortness of breath without chest pain.  He was referred to Dr. Rennis Golden and underwent stress test yesterday that was notable for diffuse ECG changes, severe shortness of breath and decreased LV function with stress despite no regional perfusion abnormalities on nuclear imaging.  The patient was admitted to the hospital and cardiac enzymes were negative.  He has never had any chest pain.  Cath performed this afternoon by Dr. Tresa Endo demonstrates critical 90% distal left main stenosis and 95% proximal right coronary artery stenosis with normal LV function.  Urgent cardiothoracic surgical consultation was requested from the cath lab.   Past Medical History  Diagnosis Date  . Hypertension   . Carotid artery stenosis   . Hyperlipidemia   . Coronary artery disease   . 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 Arthritis     Past Surgical History  Procedure Date  . Appendectomy   . Cervical disc surgery     History  Smoking status  . Former Smoker  . Types: Cigarettes  . Quit date: 03/21/2001  Smokeless tobacco  . Not on file    History  Alcohol Use  . 0.0 oz/week  . 0 Cans of beer per week    History   Social History  . Marital Status: Divorced    Spouse Name: N/A    Number of Children: N/A  . Years of Education: N/A    Occupational History  . Not on file.   Social History Main Topics  . Smoking status: Former Smoker    Types: Cigarettes    Quit date: 03/21/2001  . Smokeless tobacco: Not on file  . Alcohol Use: 0.0 oz/week    0 Cans of beer per week  . Drug Use: No  . Sexually Active: Yes   Other Topics Concern  . Not on file   Social History Narrative  . No narrative on file    No Known Allergies  Current Facility-Administered Medications  Medication Dose Route Frequency Provider Last Rate Last Dose  . 0.9 %  sodium chloride infusion  250 mL Intravenous Continuous Leone Brand, NP      . 0.9 %  sodium chloride infusion   Intravenous Continuous Leone Brand, NP 75 mL/hr at 01/28/11 0404    . acetaminophen (TYLENOL) tablet 650 mg  650 mg Oral Q4H PRN Leone Brand, NP      . ALPRAZolam Prudy Feeler) tablet 0.25 mg  0.25 mg Oral BID PRN Leone Brand, NP      . amLODipine (NORVASC) tablet 10 mg  10 mg Oral Daily Darcella Gasman  [place], NP   10 mg at 01/28/11 1140  . aspirin chewable tablet 324 mg  324 mg Oral NOW Leone Brand, NP       Or  . aspirin suppository 300 mg  300 mg Rectal NOW Leone Brand, NP      . aspirin chewable tablet 324 mg  324 mg Oral Pre-Cath Leone Brand, NP   324 mg at 01/28/11 0539  . aspirin EC tablet 325 mg  325 mg Oral Daily Leone Brand, NP      . diazepam (VALIUM) tablet 5 mg  5 mg Oral On Call Lisette Abu. Hilty   5 mg at 01/28/11 1333  . fentaNYL (SUBLIMAZE) 0.05 MG/ML injection           . heparin 100 units/mL bolus via infusion 3,000 Units  3,000 Units Intravenous Once Janice Coffin, RPH   3,000 Units at 01/28/11 0700  . heparin 100 units/mL bolus via infusion 4,000 Units  4,000 Units Intravenous Once Lennette Bihari, MD   4,000 Units at 01/27/11 1754  . heparin 2-0.9 UNIT/ML-% infusion           . heparin ADULT infusion 100 units/mL (25000 units/250 mL)  1,400 Units/hr Intravenous Continuous Janice Coffin, RPH 14 mL/hr at 01/28/11 0654 1,400  Units/hr at 01/28/11 0654  . metoprolol tartrate (LOPRESSOR) tablet 12.5 mg  12.5 mg Oral BID Leone Brand, NP   12.5 mg at 01/28/11 1140  . midazolam (VERSED) 2 MG/2ML injection           . multivitamins ther. w/minerals tablet 1 tablet  1 tablet Oral Daily Leone Brand, NP   1 tablet at 01/28/11 1143  . nitroGLYCERIN (NITROSTAT) SL tablet 0.4 mg  0.4 mg Sublingual Q5 min PRN Leone Brand, NP      . nitroGLYCERIN (NTG ON-CALL) 0.2 mg/mL injection           . omega-3 acid ethyl esters (LOVAZA) capsule 2 g  2 g Oral Daily Leone Brand, NP   2 g at 01/28/11 1139  . ondansetron (ZOFRAN) injection 4 mg  4 mg Intravenous Q6H PRN Leone Brand, NP      . pantoprazole (PROTONIX) EC tablet 40 mg  40 mg Oral Q1200 Leone Brand, NP   40 mg at 01/28/11 1249  . rosuvastatin (CRESTOR) tablet 20 mg  20 mg Oral Daily Leone Brand, NP   20 mg at 01/28/11 1139  . sodium chloride 0.9 % injection 3 mL  3 mL Intravenous Q12H Leone Brand, NP   3 mL at 01/28/11 0100  . sodium chloride 0.9 % injection 3 mL  3 mL Intravenous PRN Leone Brand, NP      . zolpidem (AMBIEN) tablet 5 mg  5 mg Oral QHS PRN Leone Brand, NP      . DISCONTD: 0.9 %  sodium chloride infusion   Intravenous Continuous Leone Brand, NP      . DISCONTD: 0.9 %  sodium chloride infusion   Intravenous Continuous Leone Brand, NP      . DISCONTD: 0.9 %  sodium chloride infusion   Intravenous Continuous Leone Brand, NP      . DISCONTD: diazepam (VALIUM) tablet 5 mg  5 mg Oral On Call Leone Brand, NP        Prescriptions prior to admission  Medication Sig Dispense Refill  . amLODipine (NORVASC) 10 MG tablet  Take 10 mg by mouth every morning.        . Multiple Vitamins-Minerals (MULTIVITAMINS THER. W/MINERALS) TABS Take 1 tablet by mouth every morning.        Marland Kitchen omega-3 acid ethyl esters (LOVAZA) 1 G capsule Take 2 g by mouth every morning.          Family History  Problem Relation Age of Onset  . Cancer Mother   .  Heart attack Father      Review of Systems:    General: Normal appetite and energy, 5'9" tall and approx. 210 lbs. HEENT: Neg Cardiac: +exertional SOB, - resting SOB, - chest pain, -PND, -syncope, - LE edema, -palpitations Resp:  Resolved URI approx 2-3 weeks ago, otherwise - cough, - wheezing, - hemoptysis GI:  - difficulty swallowing, normal bowel function, - hematochezia GU:  - dysuria, urgency Musc:  - arthritis/arthralgias Neuro:  - focal symptoms Infectious: Neg Psych:  Neg     Physical Exam:   General: Well-appearing, obese HEENT: Neg Neck:  Supple, no bruits Chest:  Clear anteriorly CV:  RRR, no murmur Abdomen: Obese, non-tender Extrem: Warm, distal pulses palpable but diminished both lower legs, no venous insuff. Neuro:  Grossly non-focal GU/rectal: Deferred   Diagnostic Studies & Laboratory data:  Cardiac cath performed today reveals 90% distal left main stenosis, 95% proximal RCA stenosis, right-dominant coronary circulation, adequate distal target vessels for grafting, normal LV function.     Recent Radiology Findings:   No results found.    Recent Lab Findings: Lab Results  Component Value Date   WBC 10.0 01/28/2011   HGB 16.8 01/28/2011   HCT 48.6 01/28/2011   PLT 223 01/28/2011   GLUCOSE 112* 01/28/2011   CHOL 168 01/28/2011   TRIG 100 01/28/2011   HDL 53 01/28/2011   LDLCALC 95 01/28/2011   NA 140 01/28/2011   K 4.0 01/28/2011   CL 107 01/28/2011   CREATININE 0.97 01/28/2011   BUN 12 01/28/2011   CO2 22 01/28/2011   TSH 3.164 01/28/2011   INR 1.01 01/28/2011   HGBA1C 5.4 01/28/2011      Assessment / Plan:      Critical left main disease with 3-vessel coronary artery disease and normal LV function.  The patient presents with stable symptoms of exertional shortness of breath without chest pain, functional class II.  He had early-positive stress test yesterday.  I agree that he would best be treated with coronary artery bypass grafting.  Although it is not  an emergent situation, the patient has critical coronary anatomy and would likely best be treated sooner rather than later.  I discussed options at length with the patient and his wife in the cath lab holding area.  Alternative treatment strategies were discussed.  They understand and accept all potential associated risks including but not limited to risk of death, stroke, myocardial infarction, bleeding requiring transfusion, arrhythmia, infection, and recurrent coronary artery disease.  All questions answered.  The patient's wife had surgery by Dr. Donata Clay who will see the patient later today and make final plans for surgery as soon as the schedule permits.

## 2011-01-28 NOTE — Plan of Care (Signed)
Problem: Phase I Progression Outcomes Goal: OOB as tolerated unless otherwise ordered Outcome: Not Applicable Date Met:  01/28/11 sheath

## 2011-01-28 NOTE — H&P (Signed)
Agree with above.  Patient seen and examined by Dr Rennis Golden in our office. Plan diagnostic cath in am. Gladine Plude,Jaxxson A 01/28/2011

## 2011-01-28 NOTE — Plan of Care (Signed)
Problem: Phase I Progression Outcomes Goal: Initial discharge plan identified Outcome: Completed/Met Date Met:  01/28/11 home

## 2011-01-29 ENCOUNTER — Encounter (HOSPITAL_COMMUNITY): Payer: Self-pay | Admitting: Anesthesiology

## 2011-01-29 ENCOUNTER — Encounter (HOSPITAL_COMMUNITY)
Admission: EM | Disposition: A | Discharge status: Home / Self Care | Payer: Self-pay | Source: Ambulatory Visit | Attending: Cardiothoracic Surgery

## 2011-01-29 ENCOUNTER — Inpatient Hospital Stay (HOSPITAL_COMMUNITY): Payer: Medicare Other | Admitting: Anesthesiology

## 2011-01-29 ENCOUNTER — Inpatient Hospital Stay (HOSPITAL_COMMUNITY): Payer: Medicare Other

## 2011-01-29 ENCOUNTER — Other Ambulatory Visit: Payer: Self-pay

## 2011-01-29 DIAGNOSIS — I2510 Atherosclerotic heart disease of native coronary artery without angina pectoris: Secondary | ICD-10-CM

## 2011-01-29 HISTORY — PX: CORONARY ARTERY BYPASS GRAFT: SHX141

## 2011-01-29 LAB — POCT I-STAT 3, ART BLOOD GAS (G3+)
Acid-base deficit: 1 mmol/L (ref 0.0–2.0)
Acid-base deficit: 2 mmol/L (ref 0.0–2.0)
Bicarbonate: 24.6 mEq/L — ABNORMAL HIGH (ref 20.0–24.0)
Bicarbonate: 23.5 mEq/L (ref 20.0–24.0)
Bicarbonate: 24.6 mEq/L — ABNORMAL HIGH (ref 20.0–24.0)
Bicarbonate: 21.5 mEq/L (ref 20.0–24.0)
Bicarbonate: 24 mEq/L (ref 20.0–24.0)
O2 Saturation: 100 %
O2 Saturation: 93 %
O2 Saturation: 92 %
Patient temperature: 36.4
TCO2: 26 mmol/L (ref 0–100)
TCO2: 25 mmol/L (ref 0–100)
TCO2: 23 mmol/L (ref 0–100)
TCO2: 25 mmol/L (ref 0–100)
pCO2 arterial: 47.1 mmHg — ABNORMAL HIGH (ref 35.0–45.0)
pCO2 arterial: 38.3 mmHg (ref 35.0–45.0)
pH, Arterial: 7.323 — ABNORMAL LOW (ref 7.350–7.450)
pO2, Arterial: 160 mmHg — ABNORMAL HIGH (ref 80.0–100.0)
pO2, Arterial: 89 mmHg (ref 80.0–100.0)

## 2011-01-29 LAB — POCT I-STAT 4, (NA,K, GLUC, HGB,HCT)
Glucose, Bld: 104 mg/dL — ABNORMAL HIGH (ref 70–99)
Glucose, Bld: 113 mg/dL — ABNORMAL HIGH (ref 70–99)
HCT: 33 % — ABNORMAL LOW (ref 39.0–52.0)
HCT: 32 % — ABNORMAL LOW (ref 39.0–52.0)
Hemoglobin: 13.6 g/dL (ref 13.0–17.0)
Hemoglobin: 10.5 g/dL — ABNORMAL LOW (ref 13.0–17.0)
Hemoglobin: 13.3 g/dL (ref 13.0–17.0)
Potassium: 4.2 mEq/L (ref 3.5–5.1)
Potassium: 4.8 mEq/L (ref 3.5–5.1)
Potassium: 5.4 mEq/L — ABNORMAL HIGH (ref 3.5–5.1)
Potassium: 4.6 mEq/L (ref 3.5–5.1)
Sodium: 143 mEq/L (ref 135–145)
Sodium: 147 mEq/L — ABNORMAL HIGH (ref 135–145)
Sodium: 141 mEq/L (ref 135–145)

## 2011-01-29 LAB — CBC
HCT: 43.7 % (ref 39.0–52.0)
HCT: 38.1 % — ABNORMAL LOW (ref 39.0–52.0)
HCT: 35.3 % — ABNORMAL LOW (ref 39.0–52.0)
Hemoglobin: 14.5 g/dL (ref 13.0–17.0)
Hemoglobin: 12.9 g/dL — ABNORMAL LOW (ref 13.0–17.0)
Hemoglobin: 12 g/dL — ABNORMAL LOW (ref 13.0–17.0)
MCH: 30.4 pg (ref 26.0–34.0)
MCH: 30.9 pg (ref 26.0–34.0)
MCH: 30.7 pg (ref 26.0–34.0)
MCHC: 33.2 g/dL (ref 30.0–36.0)
MCHC: 33.9 g/dL (ref 30.0–36.0)
MCHC: 34 g/dL (ref 30.0–36.0)
MCV: 91.4 fL (ref 78.0–100.0)
MCV: 90.3 fL (ref 78.0–100.0)
Platelets: 150 10*3/uL (ref 150–400)
Platelets: 142 10*3/uL — ABNORMAL LOW (ref 150–400)
RBC: 4.17 MIL/uL — ABNORMAL LOW (ref 4.22–5.81)
RBC: 3.91 MIL/uL — ABNORMAL LOW (ref 4.22–5.81)
RDW: 12.6 % (ref 11.5–15.5)
RDW: 12.7 % (ref 11.5–15.5)
WBC: 20.8 10*3/uL — ABNORMAL HIGH (ref 4.0–10.5)
WBC: 16.6 10*3/uL — ABNORMAL HIGH (ref 4.0–10.5)

## 2011-01-29 LAB — POCT ACTIVATED CLOTTING TIME: Activated Clotting Time: 116 seconds

## 2011-01-29 LAB — CREATININE, SERUM
Creatinine, Ser: 0.78 mg/dL (ref 0.50–1.35)
GFR calc Af Amer: >90 mL/min (ref >90)
GFR calc non Af Amer: 89 mL/min — ABNORMAL LOW (ref >90)

## 2011-01-29 LAB — POCT I-STAT GLUCOSE
Glucose, Bld: 100 mg/dL — ABNORMAL HIGH (ref 70–99)
Operator id: 173792

## 2011-01-29 LAB — POCT I-STAT, CHEM 8
BUN: 7 mg/dL (ref 6–23)
Chloride: 106 mEq/L (ref 96–112)
Creatinine, Ser: 0.8 mg/dL (ref 0.50–1.35)
Glucose, Bld: 118 mg/dL — ABNORMAL HIGH (ref 70–99)
Hemoglobin: 11.9 g/dL — ABNORMAL LOW (ref 13.0–17.0)
Potassium: 3.9 mEq/L (ref 3.5–5.1)

## 2011-01-29 LAB — GLUCOSE, CAPILLARY
Glucose-Capillary: 114 mg/dL — ABNORMAL HIGH (ref 70–99)
Glucose-Capillary: 117 mg/dL — ABNORMAL HIGH (ref 70–99)
Glucose-Capillary: 118 mg/dL — ABNORMAL HIGH (ref 70–99)
Glucose-Capillary: 120 mg/dL — ABNORMAL HIGH (ref 70–99)
Glucose-Capillary: 118 mg/dL — ABNORMAL HIGH (ref 70–99)

## 2011-01-29 LAB — HEMOGLOBIN AND HEMATOCRIT, BLOOD: HCT: 32.6 % — ABNORMAL LOW (ref 39.0–52.0)

## 2011-01-29 LAB — CARDIAC PANEL(CRET KIN+CKTOT+MB+TROPI)
Relative Index: 2.6 — ABNORMAL HIGH (ref 0.0–2.5)
Total CK: 105 U/L (ref 7–232)

## 2011-01-29 LAB — BASIC METABOLIC PANEL
BUN: 11 mg/dL (ref 6–23)
GFR calc non Af Amer: 82 mL/min — ABNORMAL LOW (ref >90)
Glucose, Bld: 125 mg/dL — ABNORMAL HIGH (ref 70–99)
Potassium: 3.8 mEq/L (ref 3.5–5.1)

## 2011-01-29 LAB — PROTIME-INR
INR: 1.25 (ref 0.00–1.49)
Prothrombin Time: 16 seconds — ABNORMAL HIGH (ref 11.6–15.2)

## 2011-01-29 LAB — PLATELET COUNT: Platelets: 136 10*3/uL — ABNORMAL LOW (ref 150–400)

## 2011-01-29 LAB — MAGNESIUM: Magnesium: 1.9 mg/dL (ref 1.5–2.5)

## 2011-01-29 LAB — APTT: aPTT: 33 seconds (ref 24–37)

## 2011-01-29 SURGERY — CORONARY ARTERY BYPASS GRAFTING (CABG)TIMES FOUR
Anesthesia: General | Site: Chest | Wound class: Clean

## 2011-01-29 SURGERY — CORONARY ARTERY BYPASS GRAFTING (CABG)TIMES THREE
Anesthesia: General | Site: Chest | Wound class: Clean

## 2011-01-29 MED ORDER — ROCURONIUM BROMIDE 100 MG/10ML IV SOLN
INTRAVENOUS | Status: DC | PRN
  Administered 2011-01-29 (×2): 20 mg via INTRAVENOUS
  Administered 2011-01-29: 80 mg via INTRAVENOUS
  Administered 2011-01-29: 30 mg via INTRAVENOUS
  Administered 2011-01-29: 20 mg via INTRAVENOUS
  Administered 2011-01-29: 30 mg via INTRAVENOUS
  Administered 2011-01-29 (×2): 20 mg via INTRAVENOUS
  Administered 2011-01-29: 30 mg via INTRAVENOUS

## 2011-01-29 MED ORDER — ALBUTEROL SULFATE (5 MG/ML) 0.5% IN NEBU: 2.5000 mg | INHALATION_SOLUTION | Freq: Four times a day (QID) | RESPIRATORY_TRACT | Status: DC

## 2011-01-29 MED ORDER — MORPHINE SULFATE 2 MG/ML IJ SOLN: 1.0000 mg | INTRAMUSCULAR | Status: AC | PRN

## 2011-01-29 MED ORDER — SODIUM CHLORIDE 0.9 % IV SOLN: 0.1000 ug/kg/h | INTRAVENOUS | Status: DC

## 2011-01-29 MED ORDER — ASPIRIN EC 325 MG PO TBEC
325.0000 mg | DELAYED_RELEASE_TABLET | Freq: Every day | ORAL | Status: DC
  Administered 2011-01-30: 325 mg via ORAL
  Filled 2011-01-29 (×2): qty 1

## 2011-01-29 MED ORDER — HEMOSTATIC AGENTS (NO CHARGE) OPTIME
TOPICAL | Status: DC | PRN
  Administered 2011-01-29 (×2): 1 via TOPICAL

## 2011-01-29 MED ORDER — MIDAZOLAM HCL 5 MG/5ML IJ SOLN
INTRAMUSCULAR | Status: DC | PRN
  Administered 2011-01-29: 1 mg via INTRAVENOUS
  Administered 2011-01-29: 3 mg via INTRAVENOUS
  Administered 2011-01-29: 1 mg via INTRAVENOUS
  Administered 2011-01-29: 3 mg via INTRAVENOUS

## 2011-01-29 MED ORDER — BISACODYL 10 MG RE SUPP: 10.0000 mg | Freq: Every day | RECTAL | Status: DC

## 2011-01-29 MED ORDER — PHENYLEPHRINE HCL 10 MG/ML IJ SOLN
0.0000 ug/min | INTRAMUSCULAR | Status: DC
  Administered 2011-01-30: 20 ug/min via INTRAVENOUS
  Filled 2011-01-29: qty 2

## 2011-01-29 MED ORDER — ALBUMIN HUMAN 5 % IV SOLN
INTRAVENOUS | Status: DC | PRN
  Administered 2011-01-29: 13:00:00 via INTRAVENOUS

## 2011-01-29 MED ORDER — AMINOCAPROIC ACID 250 MG/ML IV SOLN
INTRAVENOUS | Status: DC | PRN
  Administered 2011-01-29 (×3): 1 g via INTRAVENOUS
  Administered 2011-01-29: 5 g via INTRAVENOUS
  Administered 2011-01-29 (×6): 1 g via INTRAVENOUS

## 2011-01-29 MED ORDER — ACETAMINOPHEN 160 MG/5ML PO SOLN: 650.0000 mg | ORAL | Status: AC

## 2011-01-29 MED ORDER — PROPOFOL 10 MG/ML IV EMUL
INTRAVENOUS | Status: DC | PRN
  Administered 2011-01-29: 50 mg via INTRAVENOUS

## 2011-01-29 MED ORDER — SODIUM CHLORIDE 0.9 % IV SOLN
100.0000 [IU] | INTRAVENOUS | Status: DC | PRN
  Administered 2011-01-29: 1.2 [IU]/h via INTRAVENOUS

## 2011-01-29 MED ORDER — DOPAMINE-DEXTROSE 3.2-5 MG/ML-% IV SOLN: 0.0000 ug/kg/min | INTRAVENOUS | Status: DC

## 2011-01-29 MED ORDER — PANTOPRAZOLE SODIUM 40 MG PO TBEC: 40.0000 mg | DELAYED_RELEASE_TABLET | Freq: Every day | ORAL | Status: DC

## 2011-01-29 MED ORDER — VANCOMYCIN HCL 1000 MG IV SOLR
1000.0000 mg | Freq: Once | INTRAVENOUS | Status: AC
  Administered 2011-01-29: 1000 mg via INTRAVENOUS
  Filled 2011-01-29: qty 1000

## 2011-01-29 MED ORDER — SODIUM CHLORIDE 0.9 % IJ SOLN: 3.0000 mL | INTRAMUSCULAR | Status: DC | PRN

## 2011-01-29 MED ORDER — BISACODYL 5 MG PO TBEC
10.0000 mg | DELAYED_RELEASE_TABLET | Freq: Every day | ORAL | Status: DC
  Administered 2011-01-30: 10 mg via ORAL
  Filled 2011-01-29: qty 2

## 2011-01-29 MED ORDER — ONDANSETRON HCL 4 MG/2ML IJ SOLN: 4.0000 mg | Freq: Four times a day (QID) | INTRAMUSCULAR | Status: DC | PRN

## 2011-01-29 MED ORDER — NITROGLYCERIN IN D5W 200-5 MCG/ML-% IV SOLN: 0.0000 ug/min | INTRAVENOUS | Status: DC

## 2011-01-29 MED ORDER — DOPAMINE-DEXTROSE 3.2-5 MG/ML-% IV SOLN
INTRAVENOUS | Status: DC | PRN
  Administered 2011-01-29: 3 ug/kg/min via INTRAVENOUS

## 2011-01-29 MED ORDER — ACETAMINOPHEN 500 MG PO TABS
1000.0000 mg | ORAL_TABLET | Freq: Four times a day (QID) | ORAL | Status: DC
  Administered 2011-01-30 – 2011-01-31 (×5): 1000 mg via ORAL
  Filled 2011-01-29 (×9): qty 2

## 2011-01-29 MED ORDER — POTASSIUM CHLORIDE 10 MEQ/50ML IV SOLN
10.0000 meq | INTRAVENOUS | Status: AC
  Administered 2011-01-29 (×3): 10 meq via INTRAVENOUS

## 2011-01-29 MED ORDER — SODIUM CHLORIDE 0.9 % IV SOLN
200.0000 ug | INTRAVENOUS | Status: DC | PRN
  Administered 2011-01-29: .3 ug/kg/h via INTRAVENOUS

## 2011-01-29 MED ORDER — SODIUM CHLORIDE 0.9 % IV SOLN: INTRAVENOUS | Status: DC | PRN

## 2011-01-29 MED ORDER — MAGNESIUM SULFATE 50 % IJ SOLN
4.0000 g | Freq: Once | INTRAVENOUS | Status: AC
  Administered 2011-01-29: 4 g via INTRAVENOUS

## 2011-01-29 MED ORDER — LACTATED RINGERS IV SOLN
INTRAVENOUS | Status: DC | PRN
  Administered 2011-01-29: 07:00:00 via INTRAVENOUS

## 2011-01-29 MED ORDER — FAMOTIDINE IN NACL 20-0.9 MG/50ML-% IV SOLN
20.0000 mg | Freq: Two times a day (BID) | INTRAVENOUS | Status: AC
  Administered 2011-01-29: 20 mg via INTRAVENOUS

## 2011-01-29 MED ORDER — OXYCODONE HCL 5 MG PO TABS
5.0000 mg | ORAL_TABLET | ORAL | Status: DC | PRN
  Administered 2011-01-30: 5 mg via ORAL
  Administered 2011-01-30 – 2011-01-31 (×3): 10 mg via ORAL
  Filled 2011-01-29: qty 2
  Filled 2011-01-29: qty 1
  Filled 2011-01-29 (×2): qty 2

## 2011-01-29 MED ORDER — DEXMEDETOMIDINE HCL 100 MCG/ML IV SOLN
0.1000 ug/kg/h | INTRAVENOUS | Status: DC
  Administered 2011-01-29: 0.5 ug/kg/h via INTRAVENOUS
  Filled 2011-01-29: qty 2

## 2011-01-29 MED ORDER — ACETAMINOPHEN 160 MG/5ML PO SOLN
975.0000 mg | Freq: Four times a day (QID) | ORAL | Status: DC
  Filled 2011-01-29: qty 40.6

## 2011-01-29 MED ORDER — MAGNESIUM SULFATE 40 MG/ML IJ SOLN
INTRAMUSCULAR | Status: AC
  Administered 2011-01-29: 4 g via INTRAVENOUS
  Filled 2011-01-29: qty 100

## 2011-01-29 MED ORDER — LACTATED RINGERS IV SOLN
INTRAVENOUS | Status: DC | PRN
  Administered 2011-01-29 (×2): via INTRAVENOUS

## 2011-01-29 MED ORDER — SODIUM CHLORIDE 0.9 % IV SOLN: 250.0000 mL | INTRAVENOUS | Status: DC

## 2011-01-29 MED ORDER — SODIUM CHLORIDE 0.9 % IV SOLN
5.0000 g | INTRAVENOUS | Status: DC
  Filled 2011-01-29: qty 20

## 2011-01-29 MED ORDER — METOPROLOL TARTRATE 25 MG/10 ML ORAL SUSPENSION
12.5000 mg | Freq: Two times a day (BID) | ORAL | Status: DC
  Filled 2011-01-29 (×3): qty 5

## 2011-01-29 MED ORDER — PAPAVERINE HCL 30 MG/ML IJ SOLN
INTRAMUSCULAR | Status: DC | PRN
  Administered 2011-01-29: 60 mg via INTRAVENOUS

## 2011-01-29 MED ORDER — SODIUM CHLORIDE 0.9 % IV SOLN: INTRAVENOUS | Status: DC

## 2011-01-29 MED ORDER — ASPIRIN 81 MG PO CHEW
324.0000 mg | CHEWABLE_TABLET | Freq: Every day | ORAL | Status: DC
  Filled 2011-01-29: qty 1

## 2011-01-29 MED ORDER — SODIUM CHLORIDE 0.45 % IV SOLN: INTRAVENOUS | Status: DC

## 2011-01-29 MED ORDER — INSULIN ASPART 100 UNIT/ML ~~LOC~~ SOLN
0.0000 [IU] | SUBCUTANEOUS | Status: DC
  Filled 2011-01-29: qty 3

## 2011-01-29 MED ORDER — LEVALBUTEROL HCL 0.63 MG/3ML IN NEBU
0.6300 mg | INHALATION_SOLUTION | Freq: Four times a day (QID) | RESPIRATORY_TRACT | Status: DC
  Administered 2011-01-29 – 2011-01-31 (×8): 0.63 mg via RESPIRATORY_TRACT
  Filled 2011-01-29 (×13): qty 3

## 2011-01-29 MED ORDER — MORPHINE SULFATE 4 MG/ML IJ SOLN
2.0000 mg | INTRAMUSCULAR | Status: DC | PRN
  Administered 2011-01-29 – 2011-01-30 (×7): 4 mg via INTRAVENOUS
  Filled 2011-01-29 (×7): qty 1

## 2011-01-29 MED ORDER — ACETAMINOPHEN 650 MG RE SUPP
650.0000 mg | RECTAL | Status: AC
  Administered 2011-01-29: 650 mg via RECTAL

## 2011-01-29 MED ORDER — METOPROLOL TARTRATE 1 MG/ML IV SOLN: 2.5000 mg | INTRAVENOUS | Status: DC | PRN

## 2011-01-29 MED ORDER — LACTATED RINGERS IV SOLN: 500.0000 mL | Freq: Once | INTRAVENOUS | Status: AC | PRN

## 2011-01-29 MED ORDER — NITROGLYCERIN IN D5W 200-5 MCG/ML-% IV SOLN
INTRAVENOUS | Status: DC | PRN
  Administered 2011-01-29: 5 ug/min via INTRAVENOUS

## 2011-01-29 MED ORDER — DOCUSATE SODIUM 100 MG PO CAPS
200.0000 mg | ORAL_CAPSULE | Freq: Every day | ORAL | Status: DC
  Administered 2011-01-30: 200 mg via ORAL
  Filled 2011-01-29: qty 2

## 2011-01-29 MED ORDER — PHENYLEPHRINE HCL 10 MG/ML IJ SOLN
10000.0000 ug | INTRAVENOUS | Status: DC | PRN
  Administered 2011-01-29: 10 ug/min via INTRAVENOUS

## 2011-01-29 MED ORDER — SUFENTANIL CITRATE 50 MCG/ML IV SOLN
INTRAVENOUS | Status: DC | PRN
  Administered 2011-01-29 (×2): 20 ug via INTRAVENOUS
  Administered 2011-01-29: 45 ug via INTRAVENOUS
  Administered 2011-01-29: 5 ug via INTRAVENOUS

## 2011-01-29 MED ORDER — SODIUM CHLORIDE 0.9 % IV SOLN
0.4000 ug/kg/h | INTRAVENOUS | Status: DC
  Filled 2011-01-29: qty 2

## 2011-01-29 MED ORDER — DEXTROSE 5 % IV SOLN
1.5000 g | Freq: Two times a day (BID) | INTRAVENOUS | Status: DC
  Administered 2011-01-29 – 2011-01-30 (×3): 1.5 g via INTRAVENOUS
  Filled 2011-01-29 (×4): qty 1.5

## 2011-01-29 MED ORDER — ALBUMIN HUMAN 5 % IV SOLN
250.0000 mL | INTRAVENOUS | Status: DC | PRN
  Administered 2011-01-29: 250 mL via INTRAVENOUS

## 2011-01-29 MED ORDER — LACTATED RINGERS IV SOLN: INTRAVENOUS | Status: DC

## 2011-01-29 MED ORDER — METOPROLOL TARTRATE 12.5 MG HALF TABLET
12.5000 mg | ORAL_TABLET | Freq: Two times a day (BID) | ORAL | Status: DC
  Administered 2011-01-30: 12.5 mg via ORAL
  Filled 2011-01-29 (×5): qty 1

## 2011-01-29 MED ORDER — SODIUM CHLORIDE 0.9 % IJ SOLN
3.0000 mL | Freq: Two times a day (BID) | INTRAMUSCULAR | Status: DC
  Administered 2011-01-30 (×2): 3 mL via INTRAVENOUS

## 2011-01-29 MED ORDER — LACTATED RINGERS IV SOLN
INTRAVENOUS | Status: DC | PRN
  Administered 2011-01-29 (×2): via INTRAVENOUS

## 2011-01-29 MED ORDER — MUPIROCIN 2 % EX OINT
1.0000 "application " | TOPICAL_OINTMENT | Freq: Two times a day (BID) | CUTANEOUS | Status: AC
  Administered 2011-01-29 – 2011-02-02 (×10): 1 via NASAL
  Filled 2011-01-29: qty 22

## 2011-01-29 MED ORDER — LIDOCAINE HCL (PF) 2 % IJ SOLN
INTRAMUSCULAR | Status: DC | PRN
  Administered 2011-01-29: 5 mL

## 2011-01-29 MED ORDER — POTASSIUM CHLORIDE 10 MEQ/50ML IV SOLN
10.0000 meq | INTRAVENOUS | Status: DC | PRN
  Filled 2011-01-29: qty 100

## 2011-01-29 MED ORDER — PROTAMINE SULFATE 10 MG/ML IV SOLN
INTRAVENOUS | Status: DC | PRN
  Administered 2011-01-29: 250 mg via INTRAVENOUS

## 2011-01-29 MED ORDER — VANCOMYCIN HCL 1000 MG IV SOLR
1000.0000 mg | INTRAVENOUS | Status: DC | PRN
  Administered 2011-01-29: 1.5 g via INTRAVENOUS

## 2011-01-29 MED ORDER — HEPARIN SODIUM (PORCINE) 1000 UNIT/ML IJ SOLN
INTRAMUSCULAR | Status: DC | PRN
  Administered 2011-01-29: 18000 [IU] via INTRAVENOUS
  Administered 2011-01-29: 3000 [IU] via INTRAVENOUS
  Administered 2011-01-29: 5000 [IU] via INTRAVENOUS

## 2011-01-29 MED ORDER — DEXTROSE 5 % IV SOLN
1.5000 g | INTRAVENOUS | Status: DC | PRN
  Administered 2011-01-29: 1.5 g via INTRAVENOUS

## 2011-01-29 MED ORDER — MORPHINE SULFATE 4 MG/ML IJ SOLN
INTRAMUSCULAR | Status: AC
  Administered 2011-01-29: 4 mg via INTRAVENOUS
  Filled 2011-01-29: qty 1

## 2011-01-29 MED ORDER — DEXMEDETOMIDINE HCL 100 MCG/ML IV SOLN: 200.0000 ug | INTRAVENOUS | Status: DC

## 2011-01-29 SURGICAL SUPPLY — 130 items
ADAPTER CARDIO PERF ANTE/RETRO (ADAPTER) ×3 IMPLANT
ADPR PRFSN 84XANTGRD RTRGD (ADAPTER) ×1
APPLIER CLIP 9.375 MED OPEN (MISCELLANEOUS)
APPLIER CLIP 9.375 SM OPEN (CLIP)
APR CLP MED 9.3 20 MLT OPN (MISCELLANEOUS)
APR CLP SM 9.3 20 MLT OPN (CLIP)
ATTRACTOMAT 16X20 MAGNETIC DRP (DRAPES) ×3 IMPLANT
BAG DECANTER FOR FLEXI CONT (MISCELLANEOUS) ×3 IMPLANT
BANDAGE ELASTIC 4 VELCRO ST LF (GAUZE/BANDAGES/DRESSINGS) ×3 IMPLANT
BANDAGE ELASTIC 6 VELCRO ST LF (GAUZE/BANDAGES/DRESSINGS) ×3 IMPLANT
BANDAGE GAUZE ELAST BULKY 4 IN (GAUZE/BANDAGES/DRESSINGS) ×3 IMPLANT
BASKET HEART  (ORDER IN 25'S) (MISCELLANEOUS)
BASKET HEART (ORDER IN 25'S) (MISCELLANEOUS)
BASKET HEART (ORDER IN 25S) (MISCELLANEOUS) IMPLANT
BLADE SAW STERNAL (BLADE) ×3 IMPLANT
BLADE SURG 12 STRL SS (BLADE) IMPLANT
BLADE SURG ROTATE 9660 (MISCELLANEOUS) ×3 IMPLANT
CANISTER SUCTION 2500CC (MISCELLANEOUS) ×3 IMPLANT
CANNULA AORTIC ROOT 20012 (MISCELLANEOUS) IMPLANT
CANNULA GUNDRY RCSP 15FR (MISCELLANEOUS) ×3 IMPLANT
CATH ROBINSON RED A/P 18FR (CATHETERS) IMPLANT
CATH THORACIC 28FR (CATHETERS) ×3 IMPLANT
CATH THORACIC 28FR RT ANG (CATHETERS) ×3 IMPLANT
CATH THORACIC 36FR (CATHETERS) ×3 IMPLANT
CATH THORACIC 36FR RT ANG (CATHETERS) IMPLANT
CLIP APPLIE 9.375 MED OPEN (MISCELLANEOUS) IMPLANT
CLIP APPLIE 9.375 SM OPEN (CLIP) IMPLANT
CLIP FOGARTY SPRING 6M (CLIP) IMPLANT
CLIP TI MEDIUM 24 (CLIP) IMPLANT
CLIP TI WIDE RED SMALL 24 (CLIP) IMPLANT
CLOTH BEACON ORANGE TIMEOUT ST (SAFETY) ×3 IMPLANT
CONN Y 3/8X3/8X3/8  BEN (MISCELLANEOUS)
CONN Y 3/8X3/8X3/8 BEN (MISCELLANEOUS) IMPLANT
COVER SURGICAL LIGHT HANDLE (MISCELLANEOUS) ×6 IMPLANT
CRADLE DONUT ADULT HEAD (MISCELLANEOUS) ×3 IMPLANT
DRAPE CARDIOVASCULAR INCISE (DRAPES) ×3
DRAPE SLUSH MACHINE 52X66 (DRAPES) ×3 IMPLANT
DRAPE SLUSH/WARMER DISC (DRAPES) IMPLANT
DRAPE SRG 135X102X78XABS (DRAPES) ×1 IMPLANT
DRSG COVADERM 4X14 (GAUZE/BANDAGES/DRESSINGS) ×3 IMPLANT
ELECT BLADE 6.5 EXT (BLADE) IMPLANT
ELECT CAUTERY BLADE 6.4 (BLADE) IMPLANT
ELECT PAD GROUND ADT 9 (MISCELLANEOUS) IMPLANT
ELECT REM PT RETURN 9FT ADLT (ELECTROSURGICAL) ×6
ELECTRODE REM PT RTRN 9FT ADLT (ELECTROSURGICAL) ×2 IMPLANT
GLOVE BIO SURGEON STRL SZ 6 (GLOVE) IMPLANT
GLOVE BIO SURGEON STRL SZ 6.5 (GLOVE) IMPLANT
GLOVE BIO SURGEON STRL SZ7 (GLOVE) IMPLANT
GLOVE BIO SURGEON STRL SZ7.5 (GLOVE) ×3 IMPLANT
GLOVE BIO SURGEONS STRL SZ 6.5 (GLOVE)
GLOVE BIOGEL PI IND STRL 6 (GLOVE) ×1 IMPLANT
GLOVE BIOGEL PI IND STRL 6.5 (GLOVE) ×1 IMPLANT
GLOVE BIOGEL PI IND STRL 7.0 (GLOVE) ×1 IMPLANT
GLOVE BIOGEL PI INDICATOR 6 (GLOVE) ×2
GLOVE BIOGEL PI INDICATOR 6.5 (GLOVE) ×2
GLOVE BIOGEL PI INDICATOR 7.0 (GLOVE) ×2
GLOVE EUDERMIC 7 POWDERFREE (GLOVE) ×3 IMPLANT
GLOVE ORTHO TXT STRL SZ7.5 (GLOVE) ×3 IMPLANT
GOWN STRL NON-REIN LRG LVL3 (GOWN DISPOSABLE) ×12 IMPLANT
HEMOSTAT POWDER SURGIFOAM 1G (HEMOSTASIS) IMPLANT
HEMOSTAT SURGICEL 2X14 (HEMOSTASIS) IMPLANT
INSERT FOGARTY 61MM (MISCELLANEOUS) ×3 IMPLANT
INSERT FOGARTY XLG (MISCELLANEOUS) IMPLANT
KIT BASIN OR (CUSTOM PROCEDURE TRAY) ×3 IMPLANT
KIT PAIN CUSTOM (MISCELLANEOUS) IMPLANT
KIT ROOM TURNOVER OR (KITS) ×3 IMPLANT
KIT SUCTION CATH 14FR (SUCTIONS) IMPLANT
KIT VASOVIEW W/TROCAR VH 2000 (KITS) ×3 IMPLANT
LINE VENT (MISCELLANEOUS) IMPLANT
MARKER GRAFT CORONARY BYPASS (MISCELLANEOUS) IMPLANT
NS IRRIG 1000ML POUR BTL (IV SOLUTION) ×15 IMPLANT
PACK OPEN HEART (CUSTOM PROCEDURE TRAY) ×3 IMPLANT
PAD ARMBOARD 7.5X6 YLW CONV (MISCELLANEOUS) ×3 IMPLANT
PEDIATRIC SUCKERS (MISCELLANEOUS) IMPLANT
PENCIL BUTTON HOLSTER BLD 10FT (ELECTRODE) IMPLANT
PUNCH AORTIC ROTATE 4.0MM (MISCELLANEOUS) IMPLANT
PUNCH AORTIC ROTATE 4.5MM 8IN (MISCELLANEOUS) IMPLANT
PUNCH AORTIC ROTATE 5MM 8IN (MISCELLANEOUS) IMPLANT
SET CARDIO DLP MULTI-PER 4-LEG (TRAUMA) IMPLANT
SET CARDIOPLEGIA MPS 5001102 (MISCELLANEOUS) IMPLANT
SET MULTI PERFUSION 14000 (TRAUMA)
SOLUTION ANTI FOG 6CC (MISCELLANEOUS) ×3 IMPLANT
SPONGE GAUZE 4X4 12PLY (GAUZE/BANDAGES/DRESSINGS) ×6 IMPLANT
SPONGE INTESTINAL PEANUT (DISPOSABLE) ×3 IMPLANT
SPONGE LAP 18X18 X RAY DECT (DISPOSABLE) ×3 IMPLANT
SPONGE LAP 4X18 X RAY DECT (DISPOSABLE) ×3 IMPLANT
SUT BONE WAX W31G (SUTURE) ×3 IMPLANT
SUT MNCRL AB 4-0 PS2 18 (SUTURE) IMPLANT
SUT PROLENE 3 0 SH 1 (SUTURE) IMPLANT
SUT PROLENE 3 0 SH DA (SUTURE) IMPLANT
SUT PROLENE 3 0 SH1 36 (SUTURE) ×3 IMPLANT
SUT PROLENE 4 0 RB 1 (SUTURE)
SUT PROLENE 4 0 SH DA (SUTURE) IMPLANT
SUT PROLENE 4-0 RB1 .5 CRCL 36 (SUTURE) IMPLANT
SUT PROLENE 5 0 C 1 36 (SUTURE) ×3 IMPLANT
SUT PROLENE 6 0 C 1 30 (SUTURE) IMPLANT
SUT PROLENE 6 0 CC (SUTURE) ×3 IMPLANT
SUT PROLENE 7 0 BV 1 (SUTURE) ×3 IMPLANT
SUT PROLENE 7 0 BV1 MDA (SUTURE) ×3 IMPLANT
SUT PROLENE 7 0 DA (SUTURE) IMPLANT
SUT PROLENE 7.0 RB 3 (SUTURE) ×3 IMPLANT
SUT PROLENE 8 0 BV175 6 (SUTURE) IMPLANT
SUT PROLENE BLUE 7 0 (SUTURE) IMPLANT
SUT PROLENE POLY MONO (SUTURE) ×3 IMPLANT
SUT SILK  1 MH (SUTURE)
SUT SILK 1 MH (SUTURE) IMPLANT
SUT SILK 1 TIES 10X30 (SUTURE) IMPLANT
SUT SILK 2 0 SH CR/8 (SUTURE) IMPLANT
SUT SILK 3 0 SH CR/8 (SUTURE) ×3 IMPLANT
SUT STEEL 6MS V (SUTURE) IMPLANT
SUT STEEL STERNAL CCS#1 18IN (SUTURE) IMPLANT
SUT STEEL SZ 6 DBL 3X14 BALL (SUTURE) IMPLANT
SUT VIC AB 1 CTX 18 (SUTURE) IMPLANT
SUT VIC AB 1 CTX 36 (SUTURE)
SUT VIC AB 1 CTX36XBRD ANBCTR (SUTURE) IMPLANT
SUT VIC AB 2-0 CT1 27 (SUTURE)
SUT VIC AB 2-0 CT1 TAPERPNT 27 (SUTURE) IMPLANT
SUT VIC AB 2-0 CTX 27 (SUTURE) IMPLANT
SUT VIC AB 3-0 SH 27 (SUTURE)
SUT VIC AB 3-0 SH 27X BRD (SUTURE) IMPLANT
SUT VIC AB 3-0 X1 27 (SUTURE) IMPLANT
SUT VICRYL 4-0 PS2 18IN ABS (SUTURE) IMPLANT
SUTURE E-PAK OPEN HEART (SUTURE) ×3 IMPLANT
SYSTEM SAHARA CHEST DRAIN ATS (WOUND CARE) ×3 IMPLANT
TOWEL OR 17X24 6PK STRL BLUE (TOWEL DISPOSABLE) ×3 IMPLANT
TOWEL OR 17X26 10 PK STRL BLUE (TOWEL DISPOSABLE) ×3 IMPLANT
TRAY FOLEY IC TEMP SENS 16FR (CATHETERS) ×3 IMPLANT
TUBING INSUFFLATION 10FT LAP (TUBING) ×3 IMPLANT
UNDERPAD 30X30 INCONTINENT (UNDERPADS AND DIAPERS) ×3 IMPLANT
WATER STERILE IRR 1000ML POUR (IV SOLUTION) ×6 IMPLANT

## 2011-01-29 SURGICAL SUPPLY — 142 items
ADAPTER CARDIO PERF ANTE/RETRO (ADAPTER) IMPLANT
ADPR PRFSN 84XANTGRD RTRGD (ADAPTER)
APPLIER CLIP 9.375 MED OPEN (MISCELLANEOUS)
APPLIER CLIP 9.375 SM OPEN (CLIP)
APR CLP MED 9.3 20 MLT OPN (MISCELLANEOUS)
APR CLP SM 9.3 20 MLT OPN (CLIP)
ATTRACTOMAT 16X20 MAGNETIC DRP (DRAPES) ×3 IMPLANT
BAG DECANTER FOR FLEXI CONT (MISCELLANEOUS) ×3 IMPLANT
BANDAGE ACE 4 STERILE (GAUZE/BANDAGES/DRESSINGS) ×2 IMPLANT
BANDAGE ELASTIC 4 VELCRO ST LF (GAUZE/BANDAGES/DRESSINGS) ×3 IMPLANT
BANDAGE ELASTIC 6 VELCRO ST LF (GAUZE/BANDAGES/DRESSINGS) ×3 IMPLANT
BANDAGE GAUZE ELAST BULKY 4 IN (GAUZE/BANDAGES/DRESSINGS) ×3 IMPLANT
BASKET HEART  (ORDER IN 25'S) (MISCELLANEOUS) ×1
BASKET HEART (ORDER IN 25'S) (MISCELLANEOUS) ×1
BASKET HEART (ORDER IN 25S) (MISCELLANEOUS) ×1 IMPLANT
BLADE MINI RND TIP GREEN BEAV (BLADE) ×2 IMPLANT
BLADE NDL 3 SS STRL (BLADE) IMPLANT
BLADE NEEDLE 3 SS STRL (BLADE) ×2 IMPLANT
BLADE NEEDLE 3MM SS STRL (BLADE) ×1
BLADE SAW STERNAL (BLADE) ×3 IMPLANT
BLADE SURG 11 STRL SS (BLADE) ×2 IMPLANT
BLADE SURG 12 STRL SS (BLADE) ×3 IMPLANT
BLADE SURG ROTATE 9660 (MISCELLANEOUS) ×2 IMPLANT
CANISTER SUCTION 2500CC (MISCELLANEOUS) ×3 IMPLANT
CANNULA AORTIC ROOT 20012 (MISCELLANEOUS) ×3 IMPLANT
CANNULA GUNDRY RCSP 15FR (MISCELLANEOUS) ×2 IMPLANT
CATH CPB KIT VANTRIGT (MISCELLANEOUS) ×2 IMPLANT
CATH ROBINSON RED A/P 18FR (CATHETERS) ×8 IMPLANT
CATH THORACIC 28FR (CATHETERS) ×1 IMPLANT
CATH THORACIC 28FR RT ANG (CATHETERS) IMPLANT
CATH THORACIC 36FR (CATHETERS) ×1 IMPLANT
CATH THORACIC 36FR RT ANG (CATHETERS) ×3 IMPLANT
CLIP APPLIE 9.375 MED OPEN (MISCELLANEOUS) ×1 IMPLANT
CLIP APPLIE 9.375 SM OPEN (CLIP) ×1 IMPLANT
CLIP FOGARTY SPRING 6M (CLIP) ×2 IMPLANT
CLIP TI MEDIUM 24 (CLIP) IMPLANT
CLIP TI WIDE RED SMALL 24 (CLIP) ×2 IMPLANT
CLOTH BEACON ORANGE TIMEOUT ST (SAFETY) ×3 IMPLANT
CONN Y 3/8X3/8X3/8  BEN (MISCELLANEOUS) ×2
CONN Y 3/8X3/8X3/8 BEN (MISCELLANEOUS) ×1 IMPLANT
COVER SURGICAL LIGHT HANDLE (MISCELLANEOUS) ×8 IMPLANT
CRADLE DONUT ADULT HEAD (MISCELLANEOUS) ×3 IMPLANT
DRAPE CARDIOVASCULAR INCISE (DRAPES) ×3
DRAPE SLUSH MACHINE 52X66 (DRAPES) IMPLANT
DRAPE SLUSH/WARMER DISC (DRAPES) ×2 IMPLANT
DRAPE SRG 135X102X78XABS (DRAPES) ×1 IMPLANT
DRSG COVADERM 4X14 (GAUZE/BANDAGES/DRESSINGS) ×3 IMPLANT
ELECT BLADE 6.5 EXT (BLADE) ×3 IMPLANT
ELECT CAUTERY BLADE 6.4 (BLADE) ×3 IMPLANT
ELECT PAD GROUND ADT 9 (MISCELLANEOUS) ×3 IMPLANT
ELECT REM PT RETURN 9FT ADLT (ELECTROSURGICAL) ×6
ELECTRODE REM PT RTRN 9FT ADLT (ELECTROSURGICAL) ×2 IMPLANT
GAUZE KERLIX 2  STERILE LF (GAUZE/BANDAGES/DRESSINGS) ×2 IMPLANT
GLOVE BIO SURGEON STRL SZ 6 (GLOVE) IMPLANT
GLOVE BIO SURGEON STRL SZ 6.5 (GLOVE) ×2 IMPLANT
GLOVE BIO SURGEON STRL SZ7 (GLOVE) IMPLANT
GLOVE BIO SURGEON STRL SZ7.5 (GLOVE) ×6 IMPLANT
GLOVE BIO SURGEONS STRL SZ 6.5 (GLOVE) ×2
GLOVE BIOGEL PI IND STRL 6 (GLOVE) IMPLANT
GLOVE BIOGEL PI IND STRL 6.5 (GLOVE) IMPLANT
GLOVE BIOGEL PI IND STRL 7.0 (GLOVE) IMPLANT
GLOVE BIOGEL PI INDICATOR 6 (GLOVE)
GLOVE BIOGEL PI INDICATOR 6.5 (GLOVE)
GLOVE BIOGEL PI INDICATOR 7.0 (GLOVE) ×12
GLOVE EUDERMIC 7 POWDERFREE (GLOVE) ×6 IMPLANT
GLOVE EXAM NITRILE XS STR PU (GLOVE) ×4 IMPLANT
GLOVE ORTHO TXT STRL SZ7.5 (GLOVE) IMPLANT
GOWN PREVENTION PLUS XLARGE (GOWN DISPOSABLE) ×5 IMPLANT
GOWN STRL NON-REIN LRG LVL3 (GOWN DISPOSABLE) ×16 IMPLANT
HEMOSTAT POWDER SURGIFOAM 1G (HEMOSTASIS) ×8 IMPLANT
HEMOSTAT SURGICEL 2X14 (HEMOSTASIS) ×3 IMPLANT
INSERT FOGARTY 61MM (MISCELLANEOUS) IMPLANT
INSERT FOGARTY XLG (MISCELLANEOUS) IMPLANT
KIT BASIN OR (CUSTOM PROCEDURE TRAY) ×3 IMPLANT
KIT PAIN CUSTOM (MISCELLANEOUS) ×3 IMPLANT
KIT ROOM TURNOVER OR (KITS) ×3 IMPLANT
KIT SUCTION CATH 14FR (SUCTIONS) ×3 IMPLANT
KIT VASOVIEW W/TROCAR VH 2000 (KITS) ×3 IMPLANT
LINE VENT (MISCELLANEOUS) ×3 IMPLANT
MARKER GRAFT CORONARY BYPASS (MISCELLANEOUS) ×7 IMPLANT
MATRIX HEMOSTAT SURGIFLO (HEMOSTASIS) ×2 IMPLANT
NS IRRIG 1000ML POUR BTL (IV SOLUTION) ×15 IMPLANT
PACK OPEN HEART (CUSTOM PROCEDURE TRAY) ×3 IMPLANT
PAD ARMBOARD 7.5X6 YLW CONV (MISCELLANEOUS) ×3 IMPLANT
PENCIL BUTTON HOLSTER BLD 10FT (ELECTRODE) ×3 IMPLANT
PUNCH AORTIC ROTATE 4.0MM (MISCELLANEOUS) IMPLANT
PUNCH AORTIC ROTATE 4.5MM 8IN (MISCELLANEOUS) ×3 IMPLANT
PUNCH AORTIC ROTATE 5MM 8IN (MISCELLANEOUS) IMPLANT
SET CARDIOPLEGIA MPS 5001102 (MISCELLANEOUS) ×5 IMPLANT
SOLUTION ANTI FOG 6CC (MISCELLANEOUS) IMPLANT
SPONGE GAUZE 4X4 12PLY (GAUZE/BANDAGES/DRESSINGS) ×6 IMPLANT
SPONGE GAUZE 4X4 STERILE 39 (GAUZE/BANDAGES/DRESSINGS) ×4 IMPLANT
SPONGE INTESTINAL PEANUT (DISPOSABLE) IMPLANT
SPONGE LAP 18X18 X RAY DECT (DISPOSABLE) ×4 IMPLANT
SPONGE LAP 4X18 X RAY DECT (DISPOSABLE) ×3 IMPLANT
SUT BONE WAX W31G (SUTURE) ×3 IMPLANT
SUT MNCRL AB 4-0 PS2 18 (SUTURE) IMPLANT
SUT PROLENE 3 0 SH 1 (SUTURE) ×3 IMPLANT
SUT PROLENE 3 0 SH DA (SUTURE) ×3 IMPLANT
SUT PROLENE 3 0 SH1 36 (SUTURE) ×3 IMPLANT
SUT PROLENE 4 0 RB 1 (SUTURE)
SUT PROLENE 4 0 SH DA (SUTURE) IMPLANT
SUT PROLENE 4-0 RB1 .5 CRCL 36 (SUTURE) IMPLANT
SUT PROLENE 5 0 C 1 36 (SUTURE) IMPLANT
SUT PROLENE 6 0 C 1 30 (SUTURE) IMPLANT
SUT PROLENE 6 0 CC (SUTURE) IMPLANT
SUT PROLENE 7 0 BV 1 (SUTURE) IMPLANT
SUT PROLENE 7 0 BV1 MDA (SUTURE) ×3 IMPLANT
SUT PROLENE 7 0 DA (SUTURE) ×3 IMPLANT
SUT PROLENE 7.0 RB 3 (SUTURE) ×8 IMPLANT
SUT PROLENE 8 0 BV175 6 (SUTURE) ×2 IMPLANT
SUT PROLENE BLUE 7 0 (SUTURE) ×2 IMPLANT
SUT PROLENE POLY MONO (SUTURE) IMPLANT
SUT SILK  1 MH (SUTURE)
SUT SILK 1 MH (SUTURE) IMPLANT
SUT SILK 1 TIES 10X30 (SUTURE) ×3 IMPLANT
SUT SILK 2 0 SH CR/8 (SUTURE) ×3 IMPLANT
SUT SILK 3 0 SH CR/8 (SUTURE) ×3 IMPLANT
SUT SILK 4 0 TIE 10X30 (SUTURE) ×2 IMPLANT
SUT STEEL 6MS V (SUTURE) ×5 IMPLANT
SUT STEEL STERNAL CCS#1 18IN (SUTURE) IMPLANT
SUT STEEL SZ 6 DBL 3X14 BALL (SUTURE) ×2 IMPLANT
SUT VIC AB 1 CTX 18 (SUTURE) ×3 IMPLANT
SUT VIC AB 1 CTX 36 (SUTURE) ×6
SUT VIC AB 1 CTX36XBRD ANBCTR (SUTURE) ×2 IMPLANT
SUT VIC AB 2-0 CT1 27 (SUTURE)
SUT VIC AB 2-0 CT1 TAPERPNT 27 (SUTURE) IMPLANT
SUT VIC AB 2-0 CTX 27 (SUTURE) IMPLANT
SUT VIC AB 3-0 SH 27 (SUTURE) ×3
SUT VIC AB 3-0 SH 27X BRD (SUTURE) IMPLANT
SUT VIC AB 3-0 X1 27 (SUTURE) IMPLANT
SUT VICRYL 4-0 PS2 18IN ABS (SUTURE) IMPLANT
SUTURE E-PAK OPEN HEART (SUTURE) ×3 IMPLANT
SYSTEM SAHARA CHEST DRAIN ATS (WOUND CARE) ×3 IMPLANT
TAPE CLOTH SURG 4X10 WHT LF (GAUZE/BANDAGES/DRESSINGS) ×2 IMPLANT
TOWEL OR 17X24 6PK STRL BLUE (TOWEL DISPOSABLE) ×3 IMPLANT
TOWEL OR 17X26 10 PK STRL BLUE (TOWEL DISPOSABLE) ×3 IMPLANT
TRAY FOLEY IC TEMP SENS 14FR (CATHETERS) ×3 IMPLANT
TUBE SUCT INTRACARD DLP 20F (MISCELLANEOUS) ×3 IMPLANT
TUBING INSUFFLATION 10FT LAP (TUBING) ×3 IMPLANT
UNDERPAD 30X30 INCONTINENT (UNDERPADS AND DIAPERS) ×3 IMPLANT
WATER STERILE IRR 1000ML POUR (IV SOLUTION) ×6 IMPLANT

## 2011-01-29 NOTE — Anesthesia Preprocedure Evaluation (Addendum)
Anesthesia Evaluation  Patient identified by MRN, date of birth, ID band Patient awake    Reviewed: Allergy & Precautions, H&P , NPO status , Patient's Chart, lab work & pertinent test results, reviewed documented beta blocker date and time   Airway Mallampati: I TM Distance: >3 FB Neck ROM: Full    Dental  (+) Teeth Intact, Poor Dentition and Dental Advisory Given   Pulmonary shortness of breath,    Pulmonary exam normal       Cardiovascular hypertension, Pt. on medications and Pt. on home beta blockers + angina + CAD and + DOE     Neuro/Psych    GI/Hepatic negative GI ROS, Neg liver ROS,   Endo/Other    Renal/GU negative Renal ROS     Musculoskeletal   Abdominal   Peds  Hematology negative hematology ROS (+)   Anesthesia Other Findings   Reproductive/Obstetrics                          Anesthesia Physical Anesthesia Plan  ASA: IV  Anesthesia Plan: General   Post-op Pain Management:    Induction: Intravenous  Airway Management Planned: Oral ETT  Additional Equipment: Arterial line, PA Cath, CVP, TEE and Ultrasound Guidance Line Placement  Intra-op Plan:   Post-operative Plan: Post-operative intubation/ventilation  Informed Consent: I have reviewed the patients History and Physical, chart, labs and discussed the procedure including the risks, benefits and alternatives for the proposed anesthesia with the patient or authorized representative who has indicated his/her understanding and acceptance.   Dental advisory given  Plan Discussed with: CRNA and Surgeon  Anesthesia Plan Comments:        Anesthesia Quick Evaluation

## 2011-01-29 NOTE — Progress Notes (Signed)
Patient extubated to 5lpm nasal cannula. Sp02=92-94%. No stridor heard over upper airway. NIF=-50,VC=1.25. RSBI=17-25. Positive air leak

## 2011-01-29 NOTE — Progress Notes (Signed)
Patient sent to OR transported by anesthesia staff at bedside. Sheath in right groin level "0" soft and no hematoma. Patient denies any pain at this time. Attempted to call family but unable to reach them on the phone. Transported patients belongings in marked bag to 2300. Will sent family to PACU when they arrive. Heparin d/c'd on-call. Notified OR staff to place a line in Left wrist per md request.

## 2011-01-29 NOTE — Anesthesia Procedure Notes (Addendum)
Performed by: Sheldon Silvan A    Anesthesia Regional Block:   Narrative:

## 2011-01-29 NOTE — Anesthesia Preprocedure Evaluation (Addendum)
Anesthesia Evaluation  Patient identified by MRN, date of birth, ID band Patient awake    Reviewed: Allergy & Precautions, H&P , NPO status , Patient's Chart, lab work & pertinent test results, reviewed documented beta blocker date and time   Airway Mallampati: II TM Distance: >3 FB Neck ROM: Full    Dental  (+) Teeth Intact and Dental Advisory Given   Pulmonary shortness of breath and at rest,    Pulmonary exam normal       Cardiovascular hypertension, Pt. on medications and Pt. on home beta blockers + CAD and + DOE     Neuro/Psych    GI/Hepatic   Endo/Other    Renal/GU      Musculoskeletal   Abdominal   Peds  Hematology   Anesthesia Other Findings   Reproductive/Obstetrics                          Anesthesia Physical Anesthesia Plan  ASA: IV  Anesthesia Plan: General   Post-op Pain Management:    Induction: Intravenous  Airway Management Planned: Oral ETT  Additional Equipment: Arterial line, CVP, PA Cath and Ultrasound Guidance Line Placement  Intra-op Plan:   Post-operative Plan: Post-operative intubation/ventilation  Informed Consent: I have reviewed the patients History and Physical, chart, labs and discussed the procedure including the risks, benefits and alternatives for the proposed anesthesia with the patient or authorized representative who has indicated his/her understanding and acceptance.   Dental advisory given  Plan Discussed with: CRNA and Surgeon  Anesthesia Plan Comments:         Anesthesia Quick Evaluation

## 2011-01-29 NOTE — Anesthesia Postprocedure Evaluation (Signed)
  Anesthesia Post-op Note  Patient: Russell Patton  Procedure(s) Performed:  CORONARY ARTERY BYPASS GRAFTING (CABG)TIMES FOUR - using left internal mammary artery and endoscopically harvested right saphenous vein  Patient Location: PACU and ICU  Anesthesia Type: General  Level of Consciousness: sedated and Patient remains intubated per anesthesia plan  Airway and Oxygen Therapy: Patient remains intubated per anesthesia plan  Post-op Pain: none  Post-op Assessment: Post-op Vital signs reviewed, Patient's Cardiovascular Status Stable, Respiratory Function Stable, Patent Airway, NAUSEA AND VOMITING PRESENT, Adequate PO intake and Pain level controlled  Post-op Vital Signs: Reviewed and stable  Complications: No apparent anesthesia complications

## 2011-01-29 NOTE — Anesthesia Postprocedure Evaluation (Signed)
  Anesthesia Post-op Note  Patient: Russell Patton  Procedure(s) Performed:  CORONARY ARTERY BYPASS GRAFTING (CABG)TIMES THREE  Patient Location: PACU and ICU  Anesthesia Type: General  Level of Consciousness: sedated and Patient remains intubated per anesthesia plan  Airway and Oxygen Therapy: Patient remains intubated per anesthesia plan  Post-op Pain: none  Post-op Assessment: Post-op Vital signs reviewed, Patient's Cardiovascular Status Stable, Respiratory Function Stable, Patent Airway, No signs of Nausea or vomiting and Pain level controlled  Post-op Vital Signs: Reviewed and stable  Complications: No apparent anesthesia complications

## 2011-01-29 NOTE — Procedures (Signed)
Emilie Rutter RN from 2900 to pt bedside for Right Fem Sheath removal.  Sheath was removed and pressure held for 30  Minutes.  Pt tolerated procedure well.  VSS.  No bleeding or hematoma.  +2 pedal pulses.  Pressure dressing applied and pt instructed no to move leg.  Pt will remain in supine position with right leg straight for 6 hours.  Site will be monitored every hour.

## 2011-01-29 NOTE — Procedures (Signed)
Extubation Procedure Note  Patient Details:   Name: Russell Patton DOB: 12/22/1940 MRN: [id number]   Airway Documentation:  AIRWAYS 8 mm (Active)  Secured at (cm) 22 cm 01/29/2011 12:00 AM    Evaluation  O2 sats:  Complications: No apparent complications Patient did tolerate procedure well. Bilateral Breath Sounds: Clear   Yes  Leafy Half 01/29/2011, 8:25 PM

## 2011-01-29 NOTE — H&P (Signed)
  Patient examined and data reviewed -- no change from CT surgery consult 11-9.

## 2011-01-29 NOTE — Transfer of Care (Signed)
Immediate Anesthesia Transfer of Care Note  Patient: Russell Patton  Procedure(s) Performed:  CORONARY ARTERY BYPASS GRAFTING (CABG)TIMES FOUR - using left internal mammary artery and endoscopically harvested right saphenous vein  Patient Location: SICU  Anesthesia Type: General  Level of Consciousness: unresponsive and Patient remains intubated per anesthesia plan  Airway & Oxygen Therapy: Patient remains intubated per anesthesia plan and Patient placed on Ventilator (see vital sign flow sheet for setting)  Post-op Assessment: Report given to PACU RN  Post vital signs: Reviewed and stable  Complications: No apparent anesthesia complications

## 2011-01-29 NOTE — Brief Op Note (Signed)
01/27/2011 - 01/29/2011  2:53 PM  PATIENT:  Cyndie Chime  70 y.o. male  PRE-OPERATIVE DIAGNOSIS:  Coronary artery disease  POST-OPERATIVE DIAGNOSIS:  Coronary artery disease  PROCEDURE:  Procedure(s): CORONARY ARTERY BYPASS GRAFTING (CABG)TIMES FOUR  SURGEON:  Surgeon(s): Mikey Bussing, MD Mikey Bussing, MD  PHYSICIAN ASSISTANT: Al Corpus SA  ASSISTANTS:    ANESTHESIA:   general  EBL:  Total I/O In: 5139 [I.V.:3120; Blood:2019] Out: 2400 [Urine:2400]  BLOOD ADMINISTERED:400 CC CELLSAVER  DRAINS: one MT one L pleural tube   LOCAL MEDICATIONS USED:  NONE  SPECIMEN:    DISPOSITION OF SPECIMEN:    COUNTS:  YES  TOURNIQUET:  * No tourniquets in log *  DICTATION: .Other Dictation: Dictation Number   PLAN OF CARE: Admit to inpatient   PATIENT DISPOSITION:  ICU - intubated and critically ill.   Delay start of Pharmacological VTE agent (>24hrs) due to surgical blood loss or risk of bleeding:

## 2011-01-29 NOTE — Op Note (Signed)
[address]            [place] [address]    PROCEDURE coronary artery bypass grafting x4, LIMA to LAD, saphenous vein graft to first diagonal, saphenous vein graft to circumflex marginal, saphenous vein graft to posterior descending with endoscopic vein harvest of the right leg  SURGEON Gala Murdoch M.D. [address]  PRE-and POST diagnosis-severe left main coronary artery disease 95%, severe RCA stenosis 95%, unstable angina  ANESTHESIA Gen. by Dr.Crews  CLINICAL NOTE--the patient is a 70 year old Caucasian male who presents with unstable angina and a positive stress test. Cardiac catheterization by Dr. Nicholaus Bloom demonstrated 95% left main stenosis and 95% stenosis of the proximal RCA. The patient was recommended for surgical coronary revascularization. I discussed the procedure indications benefits and risks with patient in the CCU and he understood and agreed to proceed.  PROCEDURE NOTE--the patient was brought from the CCU to the operating room and placed supine on the operating room table. General anesthesia was induced under invasive hemodynamic monitoring. A transesophageal echo probe was  Not placed.  The patient was prepped and draped as a sterile field from the chin to the toes. A sternal incision was made as the saphenous vein was harvested endoscopically from the right leg. The left IMA was harvested as a pedicle graft from its origin at the subclavian vessels. The sternal retractor was placed and the pericardium was opened and suspended. Heparin was administered and the ACT was documented as being therapeutic. Pursestrings were placed in the ascending aorta and right atrium. The patient was cannulated and placed on cardiopulmonary bypass. The coronaries were identified for grafting and the mammary artery and vein grafts were prepared for the distal anastomoses. The patient was cooled to 32 and cardioplegia cannulas were placed for  both antegrade and retrograde cold blood cardioplegia delivery.  The cross-clamp was applied and 800 cc of cold blood cardioplegia was delivered and split doses between the aorta and coronary sinus. Good cardioplegic arrest was achieved with septal temperature to less than 10. Cardioplegia was delivered every 20 minutes with a cross-clamp was applied.  The first distal anastomosis was performed to the posterior descending. This was a 1.5 mm vessel a proximal 95% stenosis. A reverse saphenous vein was sewn end-to-side with running 7-0 Prolene with good flow.  The second distal anastomosis was to the circumflex marginal branch of the left coronary. It had a proximal 95% stenosis. Care of her saphenous vein sewn end-to-side to this 1.5 mm vessel with good flow.  The third distal anastomosis was to the diagonal branch of the LAD. This was a 1.5 mm vessel a proximal 90% stenosis. Air reverse saphenous vein was sewn end-to-side with running 7-0 Prolene with good flow.  The fourth distal anastomosis was to the mid LAD. This was a small friable vessel. It had a proximal 95% stenosis. The left IMA was brought through an opening in the pericardium and brought down onto the LAD and sewn end-to-side with running 8-0 Prolene. There is good flow through the anastomosis.  The proximal coronary anastomosis on the descending order were performed the cross-clamp was still in place. We used a 4.5 mm punch and running 7-0 Prolene for the proximal anastomoses. Prior to removing the cross-clamp air is noted from the coronaries with a dose of retrograde warm blood cardioplegia, hotshot.  Blood flow was restored to  the heart and he spontaneous rhythm resulted. Air was aspirated from the vein grafts with a 25-gauge needle. The proximal and distal anastomoses were examined and found to be hemostatic. The patient was rewarmed to 37.  Temporary pacing wires were applied at the right atrium and right ventricle. The lungs were  expanded and the ventilator was resumed. The patient was then weaned from bypass successfully with stable hemodynamics. Protamine was administered without adverse reaction. The cannulas were removed. After irrigation of the mediastinum the superior pericardial fat was closed over the aorta. A mediastinal and left pleural chest tube were placed and brought through separate incisions. The sternum was closed with interrupted steel wire. The pectoral fascia and subcutaneous layers were closed with a running Vicryl. The skin was closed with a subcuticular suture. The leg incisions were closed in a standard fashion.  The patient returned to the SICU in critical but stable condition.

## 2011-01-30 ENCOUNTER — Inpatient Hospital Stay (HOSPITAL_COMMUNITY): Payer: Medicare Other

## 2011-01-30 DIAGNOSIS — Z951 Presence of aortocoronary bypass graft: Secondary | ICD-10-CM

## 2011-01-30 DIAGNOSIS — I2510 Atherosclerotic heart disease of native coronary artery without angina pectoris: Secondary | ICD-10-CM | POA: Diagnosis present

## 2011-01-30 LAB — CREATININE, SERUM
Creatinine, Ser: 0.99 mg/dL (ref 0.50–1.35)
GFR calc Af Amer: >90 mL/min (ref >90)
GFR calc non Af Amer: 80 mL/min — ABNORMAL LOW (ref >90)

## 2011-01-30 LAB — CBC
HCT: 34.2 % — ABNORMAL LOW (ref 39.0–52.0)
HCT: 31.8 % — ABNORMAL LOW (ref 39.0–52.0)
Hemoglobin: 11.8 g/dL — ABNORMAL LOW (ref 13.0–17.0)
Hemoglobin: 10.8 g/dL — ABNORMAL LOW (ref 13.0–17.0)
MCH: 31.1 pg (ref 26.0–34.0)
MCH: 30.9 pg (ref 26.0–34.0)
MCHC: 34.5 g/dL (ref 30.0–36.0)
MCHC: 34 g/dL (ref 30.0–36.0)
MCV: 90.2 fL (ref 78.0–100.0)
MCV: 91.1 fL (ref 78.0–100.0)
Platelets: 134 10*3/uL — ABNORMAL LOW (ref 150–400)
Platelets: 122 10*3/uL — ABNORMAL LOW (ref 150–400)
RBC: 3.79 MIL/uL — ABNORMAL LOW (ref 4.22–5.81)
RBC: 3.49 MIL/uL — ABNORMAL LOW (ref 4.22–5.81)
RDW: 12.5 % (ref 11.5–15.5)
RDW: 12.7 % (ref 11.5–15.5)
WBC: 15 10*3/uL — ABNORMAL HIGH (ref 4.0–10.5)
WBC: 12.5 10*3/uL — ABNORMAL HIGH (ref 4.0–10.5)

## 2011-01-30 LAB — POCT I-STAT, CHEM 8
BUN: 15 mg/dL (ref 6–23)
Creatinine, Ser: 1 mg/dL (ref 0.50–1.35)
Glucose, Bld: 107 mg/dL — ABNORMAL HIGH (ref 70–99)
Hemoglobin: 10.5 g/dL — ABNORMAL LOW (ref 13.0–17.0)
Potassium: 3.8 mEq/L (ref 3.5–5.1)

## 2011-01-30 LAB — PREPARE PLATELET PHERESIS

## 2011-01-30 LAB — BASIC METABOLIC PANEL
BUN: 10 mg/dL (ref 6–23)
CO2: 22 mEq/L (ref 19–32)
Calcium: 8.1 mg/dL — ABNORMAL LOW (ref 8.4–10.5)
Chloride: 108 mEq/L (ref 96–112)
Creatinine, Ser: 0.8 mg/dL (ref 0.50–1.35)
GFR calc Af Amer: >90 mL/min (ref >90)
GFR calc non Af Amer: 88 mL/min — ABNORMAL LOW (ref >90)
Glucose, Bld: 132 mg/dL — ABNORMAL HIGH (ref 70–99)
Potassium: 4 mEq/L (ref 3.5–5.1)
Sodium: 139 mEq/L (ref 135–145)

## 2011-01-30 LAB — GLUCOSE, CAPILLARY

## 2011-01-30 LAB — MAGNESIUM
Magnesium: 2.3 mg/dL (ref 1.5–2.5)
Magnesium: 2.3 mg/dL (ref 1.5–2.5)

## 2011-01-30 LAB — PREPARE FRESH FROZEN PLASMA: Unit division: 0

## 2011-01-30 MED ORDER — FUROSEMIDE 10 MG/ML IJ SOLN: 40.0000 mg | Freq: Two times a day (BID) | INTRAMUSCULAR | Status: DC

## 2011-01-30 MED ORDER — INSULIN ASPART 100 UNIT/ML ~~LOC~~ SOLN: 0.0000 [IU] | SUBCUTANEOUS | Status: DC

## 2011-01-30 MED ORDER — POTASSIUM CHLORIDE 10 MEQ/50ML IV SOLN
10.0000 meq | Freq: Once | INTRAVENOUS | Status: AC
  Administered 2011-01-30: 10 meq via INTRAVENOUS

## 2011-01-30 MED ORDER — INSULIN ASPART 100 UNIT/ML ~~LOC~~ SOLN
0.0000 [IU] | SUBCUTANEOUS | Status: AC
  Administered 2011-01-30 (×2): 2 [IU] via SUBCUTANEOUS
  Filled 2011-01-30: qty 3

## 2011-01-30 MED ORDER — INSULIN ASPART 100 UNIT/ML ~~LOC~~ SOLN
0.0000 [IU] | SUBCUTANEOUS | Status: DC
  Administered 2011-01-30 (×5): 2 [IU] via SUBCUTANEOUS
  Filled 2011-01-30 (×21): qty 3

## 2011-01-30 MED ORDER — FUROSEMIDE 10 MG/ML IJ SOLN
40.0000 mg | Freq: Two times a day (BID) | INTRAMUSCULAR | Status: AC
  Administered 2011-01-30: 40 mg via INTRAMUSCULAR
  Filled 2011-01-30: qty 4

## 2011-01-30 MED ORDER — FUROSEMIDE 10 MG/ML IJ SOLN
INTRAMUSCULAR | Status: AC
  Administered 2011-01-30: 40 mg via INTRAVENOUS
  Filled 2011-01-30: qty 4

## 2011-01-30 NOTE — Progress Notes (Signed)
Patient examined and record reviewed.Hemodynamics stable,labs satisfactory.Patient had stable day.Continue current care.Supp K+ VAN TRIGT III,PETER 01/30/2011

## 2011-01-30 NOTE — Progress Notes (Signed)
Physical Therapy Evaluation Patient Details Name: Russell Patton MRN: [id number] DOB: 1940-09-27 Today's Date: 01/30/2011   PT screen completed.  Patient mobilized well: min guard assist (only secondary to sternal precautions and multiple lines).  Patient was totally independent PTA, RN staff ambulating patient without difficulty or LOB.  Recommend Cardiac rehab Phase 1 pick patient up when he moves out of the ICU.  Patient will likely benefit from Cardiac Rehab phase II at discharge.  PT to sign off.  RN staff and cardiac rehab Phase I to be in charge of his in-hospital ambulation.  No f/u PT needed and no acute PT needed.  PT to sign off.    Rollene Rotunda Haya Hemler, PT, DPT [phone] 01/30/2011, 2:12 PM

## 2011-01-30 NOTE — Progress Notes (Signed)
Subjective: "I feel like a truck hit me"  No specific complaints, except he feels full, passing small amounts of gas. Objective: Vital signs in last 24 hours: Temp:  [97.5 F (36.4 C)-99.9 F (37.7 C)] 99.5 F (37.5 C) (11/11 0800) Pulse Rate:  [81-96] 88  (11/11 0800) Resp:  [12-27] 19  (11/11 0800) BP: (86-113)/(48-74) 109/56 mmHg (11/11 0800) SpO2:  [87 %-98 %] 90 % (11/11 0800) Arterial Line BP: (74-113)/(52-83) 89/55 mmHg (11/10 2230) FiO2 (%):  [39.9 %-100 %] 50 % (11/11 0200) Weight:  [94.1 kg (207 lb 7.3 oz)-94.4 kg (208 lb 1.8 oz)] 208 lb 1.8 oz (94.4 kg) (11/11 0500) Weight change:  Last BM Date: 01/26/11 Intake/Output from previous day: 11/10 0701 - 11/11 0700 In: 7170.3 [I.V.:4151.3; Blood:2019; NG/GT:30; IV Piggyback:970] Out: 1610 [Urine:5885; Emesis/NG output:30; Blood:1200; Chest Tube:500] Intake/Output this shift: Total I/O In: 100 [P.O.:60; I.V.:40] Out: 70 [Urine:40; Chest Tube:30]  PE:  GENERAL: A & O X 3, pleasant affect. HEART:S1S2, RRR, ? Pericardial rub. LUNGS:Diminshed lung sounds in bases. ABD: + bowel sounds, hypoactive EXT: minimal edema, + pedal pulses. NEURO: Alert, oriented, follows commands.   Lab Results:  Basename 01/30/11 0405 01/29/11 2015  WBC 15.0* 16.6*  HGB 11.8* 12.0*  HCT 34.2* 35.3*  PLT 134* 142*   BMET  Basename 01/30/11 0405 01/29/11 2015 01/29/11 2011 01/29/11 0415  NA 139 -- 142 --  K 4.0 -- 3.9 --  CL 108 -- 106 --  CO2 22 -- -- 21  GLUCOSE 132* -- 118* --  BUN 10 -- 7 --  CREATININE 0.80 0.78 -- --  CALCIUM 8.1* -- -- 8.6    Basename 01/29/11 0415 01/28/11 1555  TROPONINI <0.30 <0.30    Lab Results  Component Value Date   CHOL 168 01/28/2011   HDL 53 01/28/2011   LDLCALC 95 01/28/2011   TRIG 100 01/28/2011   CHOLHDL 3.2 01/28/2011   Lab Results  Component Value Date   HGBA1C 5.4 01/28/2011     Lab Results  Component Value Date   TSH 3.164 01/28/2011    Hepatic Function Panel  Basename 01/28/11 1749   PROT 7.0  ALBUMIN 3.5  AST 19  ALT 24  ALKPHOS 78  BILITOT 0.6  BILIDIR --  IBILI --    Basename 01/28/11 0530  CHOL 168   No results found for this basename: PROTIME in the last 72 hours    EKG: Orders placed during the hospital encounter of 01/27/11  . EKG  . EKG 12-LEAD  . EKG 12-LEAD  . EKG 12-LEAD  . EKG 12-LEAD    Studies/Results: Dg Chest Portable 1 View  01/30/2011  *RADIOLOGY REPORT*  Clinical Data: Postop CABG  PORTABLE CHEST - 1 VIEW  Comparison: 01/29/2011  Findings: Interval extubation and removal of enteric tube.  Stable right IJ Swan-Ganz venous catheter, left chest tube, and mediastinal drain.  Chronic interstitial markings and/or mild interstitial edema. Bilateral lower lobe atelectasis.  No pneumothorax.  The heart is top normal in size. Postsurgical changes related to prior CABG.  Cervical spine fixation hardware.  IMPRESSION: Interval extubation and removal of enteric tube.  Otherwise stable support apparatus.  Chronic interstitial markings and/or interstitial edema.  No pneumothorax.  Original Report Authenticated By: Charline Bills, M.D.   Dg Chest Portable 1 View  01/29/2011  *RADIOLOGY REPORT*  Clinical Data: Postop CABG  PORTABLE CHEST - 1 VIEW  Comparison: 01/28/2011  Findings: Suspected mild interstitial edema.  Left basilar atelectasis.  No pleural effusion  or pneumothorax.  Endotracheal tube terminates 4 cm above the carina.  Enteric tube courses below the diaphragm.  Left apical chest tube.  Right IJ Swan-Ganz catheter with tip in the right main pulmonary artery.  Cardiomediastinal silhouette is within normal limits. Postsurgical changes related to prior CABG.  IMPRESSION: Postsurgical changes related to CABG.  No pneumothorax.  Support apparatus as above.  Suspected mild interstitial edema.  Original Report Authenticated By: Charline Bills, M.D.   Dg Chest Portable 1 View  01/28/2011  *RADIOLOGY REPORT*  Clinical Data: Bypass surgery.  Shortness  of breath.  PORTABLE CHEST - 1 VIEW  Comparison: None  Findings: The heart is upper limits of normal in size given the AP projection.  There is tortuosity and ectasia of the thoracic aorta. The pulmonary hila are grossly normal.  Low lung volumes with vascular crowding and streaky atelectasis.  Moderate vascular congestion and possible mild interstitial edema.  No pleural effusions.  No pneumothorax.  IMPRESSION:  1.  Low lung volumes with vascular crowding and atelectasis. 2.  Vascular congestion and possible mild interstitial edema.  Original Report Authenticated By: P. Loralie Champagne, M.D.    Medications: I have reviewed the patient's current medications.  Assessment/Plan: Patient Active Problem List  Diagnoses  . COLONIC POLYPS, HX OF  . DOE (dyspnea on exertion)  . Bradycardia, severe sinus  . HTN (hypertension)  . Dyslipidemia, goal LDL below 100  . Cervical spine disease    SEVERE CAD, INCLUDING LEFT MAIN.   CABG x 4 01/29/11   VOLUME OVERLOAD.  PLAN:  Pt. Progressing well, SR.  EKG with T wave inversion in Lead III. Will continue to follow along.   LOS: 3 days   INGOLD,LAURA R 01/30/2011, 8:57 AM   Examined. Agree with Corliss Blacker note. No arrhythmia. No overt CHF. Will follow.

## 2011-01-31 ENCOUNTER — Inpatient Hospital Stay (HOSPITAL_COMMUNITY): Payer: Medicare Other

## 2011-01-31 ENCOUNTER — Encounter (HOSPITAL_COMMUNITY): Payer: Self-pay | Admitting: *Deleted

## 2011-01-31 LAB — GLUCOSE, CAPILLARY
Glucose-Capillary: 151 mg/dL — ABNORMAL HIGH (ref 70–99)
Glucose-Capillary: 136 mg/dL — ABNORMAL HIGH (ref 70–99)
Glucose-Capillary: 142 mg/dL — ABNORMAL HIGH (ref 70–99)

## 2011-01-31 LAB — CBC
HCT: 30.2 % — ABNORMAL LOW (ref 39.0–52.0)
Hemoglobin: 10.2 g/dL — ABNORMAL LOW (ref 13.0–17.0)
MCH: 30.6 pg (ref 26.0–34.0)
MCHC: 33.8 g/dL (ref 30.0–36.0)
MCV: 90.7 fL (ref 78.0–100.0)
Platelets: 110 10*3/uL — ABNORMAL LOW (ref 150–400)
RBC: 3.33 MIL/uL — ABNORMAL LOW (ref 4.22–5.81)
RDW: 12.6 % (ref 11.5–15.5)
WBC: 11 10*3/uL — ABNORMAL HIGH (ref 4.0–10.5)

## 2011-01-31 LAB — BASIC METABOLIC PANEL
BUN: 19 mg/dL (ref 6–23)
CO2: 24 mEq/L (ref 19–32)
Calcium: 8.4 mg/dL (ref 8.4–10.5)
Chloride: 102 mEq/L (ref 96–112)
Creatinine, Ser: 0.97 mg/dL (ref 0.50–1.35)
GFR calc Af Amer: >90 mL/min (ref >90)
GFR calc non Af Amer: 81 mL/min — ABNORMAL LOW (ref >90)
Glucose, Bld: 116 mg/dL — ABNORMAL HIGH (ref 70–99)
Potassium: 3.9 mEq/L (ref 3.5–5.1)
Sodium: 135 mEq/L (ref 135–145)

## 2011-01-31 MED ORDER — ALUM & MAG HYDROXIDE-SIMETH 400-400-40 MG/5ML PO SUSP
15.0000 mL | ORAL | Status: DC | PRN
  Filled 2011-01-31: qty 30

## 2011-01-31 MED ORDER — ALPRAZOLAM 0.25 MG PO TABS: 0.2500 mg | ORAL_TABLET | Freq: Four times a day (QID) | ORAL | Status: DC | PRN

## 2011-01-31 MED ORDER — INSULIN ASPART 100 UNIT/ML ~~LOC~~ SOLN: 0.0000 [IU] | SUBCUTANEOUS | Status: DC

## 2011-01-31 MED ORDER — INSULIN ASPART 100 UNIT/ML ~~LOC~~ SOLN
0.0000 [IU] | Freq: Three times a day (TID) | SUBCUTANEOUS | Status: DC
  Administered 2011-01-31 (×3): 2 [IU] via SUBCUTANEOUS
  Administered 2011-02-01: 4 [IU] via SUBCUTANEOUS
  Administered 2011-02-01 – 2011-02-02 (×3): 2 [IU] via SUBCUTANEOUS

## 2011-01-31 MED ORDER — ROSUVASTATIN CALCIUM 20 MG PO TABS
20.0000 mg | ORAL_TABLET | Freq: Every day | ORAL | Status: DC
  Administered 2011-01-31 – 2011-02-02 (×3): 20 mg via ORAL
  Filled 2011-01-31 (×4): qty 1

## 2011-01-31 MED ORDER — POVIDONE-IODINE 10 % EX SOLN
1.0000 "application " | Freq: Two times a day (BID) | CUTANEOUS | Status: DC
  Administered 2011-01-31 – 2011-02-03 (×7): 1 via TOPICAL
  Filled 2011-01-31: qty 15

## 2011-01-31 MED ORDER — BISACODYL 10 MG RE SUPP
10.0000 mg | Freq: Every day | RECTAL | Status: DC | PRN
  Administered 2011-02-02: 10 mg via RECTAL
  Filled 2011-01-31: qty 1

## 2011-01-31 MED ORDER — MOVING RIGHT ALONG BOOK
Freq: Once | Status: AC
  Administered 2011-01-31: 14:00:00
  Filled 2011-01-31: qty 1

## 2011-01-31 MED ORDER — PROMETHAZINE HCL 25 MG/ML IJ SOLN: 12.5000 mg | Freq: Four times a day (QID) | INTRAMUSCULAR | Status: DC | PRN

## 2011-01-31 MED ORDER — BIOTENE DRY MOUTH MT LIQD: 15.0000 mL | Freq: Two times a day (BID) | OROMUCOSAL | Status: DC

## 2011-01-31 MED ORDER — FUROSEMIDE 40 MG PO TABS
40.0000 mg | ORAL_TABLET | Freq: Every day | ORAL | Status: DC
  Administered 2011-01-31 – 2011-02-03 (×4): 40 mg via ORAL
  Filled 2011-01-31 (×5): qty 1

## 2011-01-31 MED ORDER — MAGNESIUM HYDROXIDE 400 MG/5ML PO SUSP: 30.0000 mL | Freq: Every day | ORAL | Status: DC | PRN

## 2011-01-31 MED ORDER — PANTOPRAZOLE SODIUM 40 MG PO TBEC
40.0000 mg | DELAYED_RELEASE_TABLET | Freq: Every day | ORAL | Status: DC
  Administered 2011-02-01 – 2011-02-02 (×2): 40 mg via ORAL
  Filled 2011-01-31 (×2): qty 1

## 2011-01-31 MED ORDER — DOCUSATE SODIUM 100 MG PO CAPS
200.0000 mg | ORAL_CAPSULE | Freq: Every day | ORAL | Status: DC
  Administered 2011-01-31 – 2011-02-03 (×4): 200 mg via ORAL
  Filled 2011-01-31: qty 1
  Filled 2011-01-31 (×4): qty 2

## 2011-01-31 MED ORDER — LEVALBUTEROL TARTRATE 45 MCG/ACT IN AERO
2.0000 | INHALATION_SPRAY | Freq: Four times a day (QID) | RESPIRATORY_TRACT | Status: DC
  Filled 2011-01-31: qty 15

## 2011-01-31 MED ORDER — OXYCODONE HCL 5 MG PO TABS
5.0000 mg | ORAL_TABLET | ORAL | Status: DC | PRN
  Administered 2011-01-31 (×2): 5 mg via ORAL
  Administered 2011-01-31 – 2011-02-02 (×4): 10 mg via ORAL
  Filled 2011-01-31: qty 1
  Filled 2011-01-31 (×3): qty 2
  Filled 2011-01-31: qty 1
  Filled 2011-01-31: qty 2

## 2011-01-31 MED ORDER — GUAIFENESIN ER 600 MG PO TB12
600.0000 mg | ORAL_TABLET | Freq: Two times a day (BID) | ORAL | Status: DC | PRN
  Filled 2011-01-31: qty 1

## 2011-01-31 MED ORDER — LEVALBUTEROL TARTRATE 45 MCG/ACT IN AERO
2.0000 | INHALATION_SPRAY | Freq: Four times a day (QID) | RESPIRATORY_TRACT | Status: DC
  Administered 2011-01-31: 2 via RESPIRATORY_TRACT

## 2011-01-31 MED ORDER — BISACODYL 5 MG PO TBEC: 10.0000 mg | DELAYED_RELEASE_TABLET | Freq: Every day | ORAL | Status: DC | PRN

## 2011-01-31 MED ORDER — METOPROLOL TARTRATE 12.5 MG HALF TABLET
12.5000 mg | ORAL_TABLET | Freq: Two times a day (BID) | ORAL | Status: DC
  Administered 2011-01-31 – 2011-02-03 (×6): 12.5 mg via ORAL
  Filled 2011-01-31 (×8): qty 1

## 2011-01-31 MED ORDER — ACETAMINOPHEN 325 MG PO TABS
650.0000 mg | ORAL_TABLET | Freq: Four times a day (QID) | ORAL | Status: DC | PRN
  Administered 2011-02-01: 650 mg via ORAL
  Filled 2011-01-31: qty 2

## 2011-01-31 MED ORDER — POTASSIUM CHLORIDE CRYS ER 20 MEQ PO TBCR
20.0000 meq | EXTENDED_RELEASE_TABLET | Freq: Two times a day (BID) | ORAL | Status: DC
  Administered 2011-01-31 – 2011-02-03 (×7): 20 meq via ORAL
  Filled 2011-01-31 (×10): qty 1

## 2011-01-31 MED ORDER — TRAMADOL HCL 50 MG PO TABS: 50.0000 mg | ORAL_TABLET | ORAL | Status: DC | PRN

## 2011-01-31 MED ORDER — LEVALBUTEROL TARTRATE 45 MCG/ACT IN AERO
2.0000 | INHALATION_SPRAY | Freq: Four times a day (QID) | RESPIRATORY_TRACT | Status: DC
  Administered 2011-01-31 – 2011-02-03 (×10): 2 via RESPIRATORY_TRACT

## 2011-01-31 MED ORDER — SODIUM CHLORIDE 0.9 % IJ SOLN
3.0000 mL | Freq: Two times a day (BID) | INTRAMUSCULAR | Status: DC
  Administered 2011-01-31 – 2011-02-03 (×7): 3 mL via INTRAVENOUS

## 2011-01-31 MED ORDER — CLOPIDOGREL BISULFATE 75 MG PO TABS
75.0000 mg | ORAL_TABLET | Freq: Every day | ORAL | Status: DC
  Administered 2011-02-01 – 2011-02-03 (×3): 75 mg via ORAL
  Filled 2011-01-31 (×4): qty 1

## 2011-01-31 NOTE — Progress Notes (Signed)
Subjective: Somewhat restless, can't be comfortable in bed or chair. Otherwise no specific complaints. Objective: Vital signs in last 24 hours: Temp:  [97.6 F (36.4 C)-98.5 F (36.9 C)] 98.4 F (36.9 C) (11/12 1200) Pulse Rate:  [54-133] 93  (11/12 1300) Resp:  [12-25] 25  (11/12 1300) BP: (77-131)/(16-71) 123/66 mmHg (11/12 1300) SpO2:  [87 %-98 %] 94 % (11/12 1352) Weight:  [94.1 kg (207 lb 7.3 oz)] 207 lb 7.3 oz (94.1 kg) (11/12 0500) Weight change: 0 kg (0 lb) Last BM Date: 01/26/11 Intake/Output from previous day: 11/11 0701 - 11/12 0700 In: 1780.5 [P.O.:1140; I.V.:440.5; IV Piggyback:200] Out: 1965 [Urine:1885; Chest Tube:80] Intake/Output this shift: Total I/O In: 760 [P.O.:760] Out: 480 [Urine:480]  PE: GENERAL: A & O X 3, pleasant affect.  HEART:S1S2, RRR,  So not hear Pericardial rub today.  LUNGS:Diminshed lung sounds in bases.  ABD: + bowel sounds, hypoactive  EXT: minimal edema, + pedal pulses.  NEURO: Alert, oriented, follows commands.   Lab Results:  Basename 01/31/11 0415 01/30/11 1700  WBC 11.0* 12.5*  HGB 10.2* 10.8*  HCT 30.2* 31.8*  PLT 110* 122*   BMET  Basename 01/31/11 0415 01/30/11 1700 01/30/11 1638 01/30/11 0405  NA 135 -- 139 --  K 3.9 -- 3.8 --  CL 102 -- 101 --  CO2 24 -- -- 22  GLUCOSE 116* -- 107* --  BUN 19 -- 15 --  CREATININE 0.97 0.99 -- --  CALCIUM 8.4 -- -- 8.1*    Basename 01/29/11 0415 01/28/11 1555  TROPONINI <0.30 <0.30    Lab Results  Component Value Date   CHOL 168 01/28/2011   HDL 53 01/28/2011   LDLCALC 95 01/28/2011   TRIG 100 01/28/2011   CHOLHDL 3.2 01/28/2011   Lab Results  Component Value Date   HGBA1C 5.4 01/28/2011     Lab Results  Component Value Date   TSH 3.164 01/28/2011    Hepatic Function Panel  Basename 01/28/11 1749  PROT 7.0  ALBUMIN 3.5  AST 19  ALT 24  ALKPHOS 78  BILITOT 0.6  BILIDIR --  IBILI --   No results found for this basename: CHOL in the last 72 hours No results found  for this basename: PROTIME in the last 72 hours    EKG: Orders placed during the hospital encounter of 01/27/11  . EKG  . EKG 12-LEAD  . EKG 12-LEAD    Studies/Results: Dg Chest Portable 1 View  01/31/2011  *RADIOLOGY REPORT*  Clinical Data: Status post open heart surgery  PORTABLE CHEST - 1 VIEW  Comparison: 01/30/2011  Findings: There is a right IJ catheter with tip in the SVC. Removal of the Swan-Ganz catheter.  Mediastinal drain and left chest tubes have been removed.  No pneumothorax.  Atelectasis in the right midlung and left base is stable.  Chronic interstitial markings are similar to previous exam.  IMPRESSION:  1.  No complications after removal of mediastinal drain and left chest tube.  Original Report Authenticated By: Rosealee Albee, M.D.   Dg Chest Portable 1 View  01/30/2011  *RADIOLOGY REPORT*  Clinical Data: Postop CABG  PORTABLE CHEST - 1 VIEW  Comparison: 01/29/2011  Findings: Interval extubation and removal of enteric tube.  Stable right IJ Swan-Ganz venous catheter, left chest tube, and mediastinal drain.  Chronic interstitial markings and/or mild interstitial edema. Bilateral lower lobe atelectasis.  No pneumothorax.  The heart is top normal in size. Postsurgical changes related to prior CABG.  Cervical spine  fixation hardware.  IMPRESSION: Interval extubation and removal of enteric tube.  Otherwise stable support apparatus.  Chronic interstitial markings and/or interstitial edema.  No pneumothorax.  Original Report Authenticated By: Charline Bills, M.D.   Dg Chest Portable 1 View  01/29/2011  *RADIOLOGY REPORT*  Clinical Data: Postop CABG  PORTABLE CHEST - 1 VIEW  Comparison: 01/28/2011  Findings: Suspected mild interstitial edema.  Left basilar atelectasis.  No pleural effusion or pneumothorax.  Endotracheal tube terminates 4 cm above the carina.  Enteric tube courses below the diaphragm.  Left apical chest tube.  Right IJ Swan-Ganz catheter with tip in the right main  pulmonary artery.  Cardiomediastinal silhouette is within normal limits. Postsurgical changes related to prior CABG.  IMPRESSION: Postsurgical changes related to CABG.  No pneumothorax.  Support apparatus as above.  Suspected mild interstitial edema.  Original Report Authenticated By: Charline Bills, M.D.    Medications: I have reviewed the patient's current medications.  Assessment/Plan: Patient Active Problem List  Diagnoses  . COLONIC POLYPS, HX OF  . DOE (dyspnea on exertion)  . Bradycardia, severe sinus  . HTN (hypertension)  . Dyslipidemia, goal LDL below 100  . Cervical spine disease  . CAD (coronary artery disease), native coronary artery  . S/P CABG x 4   PLAN:  Progressing well.  EKG without acute changes. SR. Continues on diuretic, statin. I don not see beta blocker?      LOS: 4 days   INGOLD,LAURA R 01/31/2011, 1:56 PM  Patient seen and examined. Agree with assessment and plan. Pt is day 2 s/p CABG for severe LM and RCA disease.     Will restart low dose beta blocker.  Add ACE-I or ARB as BP allows.               KELLY,Bertran A 01/31/2011 6:06 PM

## 2011-01-31 NOTE — Progress Notes (Signed)
2 Days Post-Op Procedure(s) (LRB): CORONARY ARTERY BYPASS GRAFTING (CABG)TIMES FOUR (N/A) Subjectiv                   [address]            [place] [address]          [phone]       Objective:NSR,O2 sat 92 on 3l/min  Walked in hall stable resp status  Vital signs in last 24 hours Temp:  [97.6 F (36.4 C)-98.5 F (36.9 C)] 98.5 F (36.9 C) (11/12 0800) Pulse Rate:  [54-133] 94  (11/12 0900) Cardiac Rhythm:  [-] Normal sinus rhythm (11/12 0800) Resp:  [12-24] 19  (11/12 0900) BP: (77-131)/(16-71) 109/62 mmHg (11/12 0900) SpO2:  [87 %-98 %] 91 % (11/12 0900) Weight:  [207 lb 7.3 oz (94.1 kg)] 207 lb 7.3 oz (94.1 kg) (11/12 0500)  Hemodynamic parameters for last 24 hours:  stable  Intake/Output from previous day: 11/11 0701 - 11/12 0700 In: 1780.5 [P.O.:1140; I.V.:440.5; IV Piggyback:200] Out: 1965 [Urine:1885; Chest Tube:80] Intake/Output this shift: Total I/O In: -  Out: 185 [Urine:185]  Lungs clear Cor w/o murmur Abd soft Incisions clean  Lab Results:  Basename 01/31/11 0415 01/30/11 1700  WBC 11.0* 12.5*  HGB 10.2* 10.8*  HCT 30.2* 31.8*  PLT 110* 122*   BMET:  Basename 01/31/11 0415 01/30/11 1700 01/30/11 1638 01/30/11 0405  NA 135 -- 139 --  K 3.9 -- 3.8 --  CL 102 -- 101 --  CO2 24 -- -- 22  GLUCOSE 116* -- 107* --  BUN 19 -- 15 --  CREATININE 0.97 0.99 -- --  CALCIUM 8.4 -- -- 8.1*    PT/INR:  Basename 01/29/11 1530  LABPROT 16.0*  INR 1.25   ABG    Component Value Date/Time   PHART 7.406 01/29/2011 2252   HCO3 24.0 01/29/2011 2252   TCO2 23 01/30/2011 1638   ACIDBASEDEF 3.0* 01/29/2011 2012   O2SAT 92.0 01/29/2011 2252   CBG (last 3)   Basename 01/31/11 0726 01/31/11 0406 01/30/11 2316  GLUCAP 117* 118* 120*    Assessment/Plan: S/P Procedure(s) (LRB): CORONARY ARTERY BYPASS GRAFTING (CABG)TIMES FOUR (N/A) See progression orders   LOS: 4 days    VAN TRIGT III,PETER 01/31/2011

## 2011-01-31 NOTE — Progress Notes (Signed)
UR Completed.   Russell Patton [name] 01/31/2011  

## 2011-02-01 ENCOUNTER — Inpatient Hospital Stay (HOSPITAL_COMMUNITY): Payer: Medicare Other

## 2011-02-01 LAB — CBC
HCT: 28.4 % — ABNORMAL LOW (ref 39.0–52.0)
Hemoglobin: 9.6 g/dL — ABNORMAL LOW (ref 13.0–17.0)
MCH: 30.6 pg (ref 26.0–34.0)
MCHC: 33.8 g/dL (ref 30.0–36.0)
MCV: 90.4 fL (ref 78.0–100.0)
Platelets: 126 10*3/uL — ABNORMAL LOW (ref 150–400)
RBC: 3.14 MIL/uL — ABNORMAL LOW (ref 4.22–5.81)
RDW: 12.7 % (ref 11.5–15.5)
WBC: 11 10*3/uL — ABNORMAL HIGH (ref 4.0–10.5)

## 2011-02-01 LAB — BASIC METABOLIC PANEL
BUN: 17 mg/dL (ref 6–23)
CO2: 25 mEq/L (ref 19–32)
Calcium: 8.6 mg/dL (ref 8.4–10.5)
Chloride: 100 mEq/L (ref 96–112)
Creatinine, Ser: 0.94 mg/dL (ref 0.50–1.35)
GFR calc Af Amer: >90 mL/min (ref >90)
GFR calc non Af Amer: 82 mL/min — ABNORMAL LOW (ref >90)
Glucose, Bld: 125 mg/dL — ABNORMAL HIGH (ref 70–99)
Potassium: 4 mEq/L (ref 3.5–5.1)
Sodium: 134 mEq/L — ABNORMAL LOW (ref 135–145)

## 2011-02-01 LAB — GLUCOSE, CAPILLARY: Glucose-Capillary: 169 mg/dL — ABNORMAL HIGH (ref 70–99)

## 2011-02-01 MED ORDER — MOXIFLOXACIN HCL 400 MG PO TABS
400.0000 mg | ORAL_TABLET | Freq: Every day | ORAL | Status: DC
  Administered 2011-02-01 – 2011-02-02 (×2): 400 mg via ORAL
  Filled 2011-02-01 (×3): qty 1

## 2011-02-01 MED FILL — Nitroglycerin IV Soln 200 MCG/ML in D5W: INTRAVENOUS | Qty: 250 | Status: AC

## 2011-02-01 MED FILL — Cefuroxime Sodium For Inj 750 MG: INTRAMUSCULAR | Qty: 750 | Status: AC

## 2011-02-01 MED FILL — Vancomycin HCl For IV Soln 1 GM (Base Equivalent): INTRAVENOUS | Qty: 1500 | Status: AC

## 2011-02-01 MED FILL — Aminocaproic Acid Inj 250 MG/ML: INTRAVENOUS | Qty: 40 | Status: AC

## 2011-02-01 MED FILL — Magnesium Sulfate Inj 50%: INTRAMUSCULAR | Qty: 2 | Status: AC

## 2011-02-01 MED FILL — Dexmedetomidine HCl IV Soln 200 MCG/2ML: INTRAVENOUS | Qty: 4 | Status: AC

## 2011-02-01 MED FILL — Papaverine HCl Inj 30 MG/ML: INTRAMUSCULAR | Qty: 2 | Status: AC

## 2011-02-01 MED FILL — Dopamine in Dextrose 5% Inj 3.2 MG/ML: INTRAVENOUS | Qty: 250 | Status: AC

## 2011-02-01 MED FILL — Heparin Sodium (Porcine) Inj 5000 Unit/ML: INTRAMUSCULAR | Qty: 1 | Status: AC

## 2011-02-01 MED FILL — Epinephrine HCl Inj 1 MG/ML: INTRAMUSCULAR | Qty: 4 | Status: AC

## 2011-02-01 MED FILL — Potassium Chloride Inj 2 mEq/ML: INTRAVENOUS | Qty: 40 | Status: AC

## 2011-02-01 MED FILL — Cefuroxime Sodium For Inj 1.5 GM: INTRAMUSCULAR | Qty: 1.5 | Status: AC

## 2011-02-01 MED FILL — Phenylephrine HCl Inj 10 MG/ML: INTRAMUSCULAR | Qty: 2 | Status: AC

## 2011-02-01 MED FILL — Insulin Regular (Human) Inj 100 Unit/ML: INTRAMUSCULAR | Qty: 10 | Status: AC

## 2011-02-01 NOTE — Progress Notes (Signed)
cardCARDIAC REHAB PHASE I   PRE:  Rate/Rhythm: 85SR  BP:  Supine:   Sitting: 104/60  Standing:    SaO2: 944L  MODE:  Ambulation: 350 ft   POST:  Rate/Rhythem: 90  BP:  Supine: 126/70  Sitting:   Standing:    SaO2: 93%4L  Pt walked 350 ft on 4L with asst x 2. Gait steady. Stopped a couple of times to rest. Can be asst x 1 next walk. To bed after walk. Encouraged IS. [phone] Duanne Limerick

## 2011-02-01 NOTE — Progress Notes (Signed)
Please have case management evaluate patient for possible home oxygen upon discharge, per Dr. Morton Peters.  Russell Patton 02/01/2011

## 2011-02-01 NOTE — Progress Notes (Signed)
[facility]  Subjective: SOB improved considerably   Objective: Vital signs in last 24 hours: Temp:  [98.4 F (36.9 C)-99.7 F (37.6 C)] 98.6 F (37 C) (11/13 0800) Pulse Rate:  [73-106] 92  (11/13 1100) Resp:  [14-30] 18  (11/13 1100) BP: (102-126)/(56-82) 123/75 mmHg (11/13 1100) SpO2:  [89 %-97 %] 97 % (11/13 1138) Weight:  [94 kg (207 lb 3.7 oz)] 207 lb 3.7 oz (94 kg) (11/13 0600) Last BM Date: 01/26/11  Intake/Output from previous day: 11/12 0701 - 11/13 0700 In: 1240 [P.O.:1240] Out: 1830 [Urine:1830] Intake/Output this shift: Total I/O In: 200 [P.O.:200] Out: -   Medications Current Facility-Administered Medications  Medication Dose Route Frequency Provider Last Rate Last Dose  . acetaminophen (TYLENOL) tablet 650 mg  650 mg Oral Q6H PRN Kathlee Nations Trigt III, MD      . ALPRAZolam Prudy Feeler) tablet 0.25 mg  0.25 mg Oral Q6H PRN Kathlee Nations Trigt III, MD      . alum & mag hydroxide-simeth (MAALOX PLUS) 400-400-40 MG/5ML suspension 15-30 mL  15-30 mL Oral Q4H PRN Kathlee Nations Trigt III, MD      . bisacodyl (DULCOLAX) EC tablet 10 mg  10 mg Oral Daily PRN Kathlee Nations Trigt III, MD       Or  . bisacodyl (DULCOLAX) suppository 10 mg  10 mg Rectal Daily PRN Kathlee Nations Trigt III, MD      . clopidogrel (PLAVIX) tablet 75 mg  75 mg Oral Q breakfast Kathlee Nations Trigt III, MD   75 mg at 02/01/11 0810  . docusate sodium (COLACE) capsule 200 mg  200 mg Oral Daily Kathlee Nations Trigt III, MD   200 mg at 02/01/11 1610  . furosemide (LASIX) tablet 40 mg  40 mg Oral Daily Kathlee Nations Trigt III, MD   40 mg at 02/01/11 0935  . guaiFENesin (MUCINEX) 12 hr tablet 600 mg  600 mg Oral Q12H PRN Kathlee Nations Trigt III, MD      . insulin aspart (novoLOG) injection 0-24 Units  0-24 Units Subcutaneous TID AC & HS Kerin Perna III, MD   2 Units at 01/31/11 2138  . levalbuterol (XOPENEX HFA) inhaler 2 puff  2 puff Inhalation QID Loreli Slot, MD   2 puff at 02/01/11 1138  .  magnesium hydroxide (MILK OF MAGNESIA) suspension 30 mL  30 mL Oral Daily PRN Kathlee Nations Trigt III, MD      . metoprolol tartrate (LOPRESSOR) tablet 12.5 mg  12.5 mg Oral BID Lennette Bihari, MD   12.5 mg at 02/01/11 0934  . moving right along book   Does not apply Once Kathlee Nations Trigt III, MD      . moxifloxacin (AVELOX) tablet 400 mg  400 mg Oral q1800 Kathlee Nations Trigt III, MD      . mupirocin [facility]) 2 % ointment 1 application  1 application Nasal BID Lisette Abu Hilty   1 application at 02/01/11 0935  . oxyCODONE (Oxy IR/ROXICODONE) immediate release tablet 5-10 mg  5-10 mg Oral Q3H PRN Kathlee Nations Trigt III, MD   10 mg at 02/01/11 1041  . pantoprazole (PROTONIX) EC tablet 40 mg  40 mg Oral QAC breakfast Kathlee Nations Trigt III, MD   40 mg at 02/01/11 0810  . potassium chloride SA (K-DUR,KLOR-CON) CR tablet 20 mEq  20 mEq Oral BID Kathlee Nations Trigt III, MD   20 mEq at 02/01/11 0934  . povidone-iodine (BETADINE) 10 %  external solution 1 application  1 application Topical BID Kathlee Nations Trigt III, MD   1 application at 02/01/11 1000  . promethazine (PHENERGAN) injection 12.5 mg  12.5 mg Intravenous Q6H PRN Kathlee Nations Trigt III, MD      . rosuvastatin (CRESTOR) tablet 20 mg  20 mg Oral q1800 Kathlee Nations Trigt III, MD   20 mg at 01/31/11 1715  . sodium chloride 0.9 % injection 3 mL  3 mL Intravenous Q12H Kathlee Nations Trigt III, MD   3 mL at 02/01/11 1000  . traMADol (ULTRAM) tablet 50-100 mg  50-100 mg Oral Q4H PRN Kathlee Nations Suann Larry, MD      . DISCONTD: levalbuterol [facility] HFA) inhaler 2 puff  2 puff Inhalation Q6H Kathlee Nations Suann Larry, MD      . DISCONTD: levalbuterol [facility] HFA) inhaler 2 puff  2 puff Inhalation Q6H Kathlee Nations Maudie Flakes III, MD   2 puff at 01/31/11 1348  . DISCONTD: levalbuterol (XOPENEX) nebulizer solution 0.63 mg  0.63 mg Nebulization Q6H Kathlee Nations Trigt III, MD   0.63 mg at 01/31/11 0820    PE: General appearance: alert, cooperative and no distress Lungs: clear to auscultation  bilaterally Heart: regular rate and rhythm, S1, S2 normal, no murmur, click, rub or gallop Extremities: trace LEE on right.  None on Left Pulses: Left Dorsalis Pedis present 1+ Right Dorsalis Pedis present 1+ Right radial not palpable.  Left radial 2+.  Lab Results:   Basename 02/01/11 0505 01/31/11 0415 01/30/11 1700  WBC 11.0* 11.0* 12.5*  HGB 9.6* 10.2* 10.8*  HCT 28.4* 30.2* 31.8*  PLT 126* 110* 122*   BMET  Basename 02/01/11 0505 01/31/11 0415 01/30/11 1700 01/30/11 1638 01/30/11 0405  NA 134* 135 -- 139 --  K 4.0 3.9 -- 3.8 --  CL 100 102 -- 101 --  CO2 25 24 -- -- 22  GLUCOSE 125* 116* -- 107* --  BUN 17 19 -- 15 --  CREATININE 0.94 0.97 0.99 -- --  CALCIUM 8.6 8.4 -- -- 8.1*   PT/INR  Basename 01/29/11 1530  LABPROT 16.0*  INR 1.25    Studies/Results: CXR IMPRESSION:  1. Low-level interstitial edema is likely superimposed on some  mild chronic interstitial lung disease.  2. Linear opacities at the left lung base favor atelectasis.  3. Trace bilateral pleural effusions.    Assessment/Plan  Principal Problem:  *DOE (dyspnea on exertion) Active Problems: S/P CABG x 4- POD #3  Bradycardia, severe sinus HTN (hypertension) Dyslipidemia, goal LDL below 100 Cervical spine disease CAD (coronary artery disease), native coronary artery Anemia   Plan:  BP stable.  HR elevated in the 90's. Hgb slightly decreased.  Would tolerate increased lopressor to help HR.   LOS: 5 days    HAGER,BRYAN W 02/01/2011 11:46 AM  I have seen and examined the patient along with HAGER,BRYAN W, PA.  I have reviewed the chart, notes and new data.  I agree with Bryan's note.  Key new complaints: none, transferred to tele today Key examination changes: agree with above Key new findings / data: none  PLAN: Agree wit titration of BB dose. Continue Statin.   HARDING,DAVID W 02/01/2011, 6:12 PM

## 2011-02-01 NOTE — Progress Notes (Addendum)
3 Days Post-Op Procedure(s) (LRB): CORONARY ARTERY BYPASS GRAFTING (CABG)TIMES FOUR (N/A) Subjective:                    [address]            [place] [address]          [phone]       productive cough,WBC up 15k   Objective: Vital signs in last 24 hours: Temp:  [98.4 F (36.9 C)-99.7 F (37.6 C)] 98.7 F (37.1 C) (11/13 0400) Pulse Rate:  [73-106] 86  (11/13 0700) Cardiac Rhythm:  [-] Normal sinus rhythm (11/13 0100) Resp:  [14-30] 25  (11/13 0700) BP: (102-126)/(56-82) 114/58 mmHg (11/13 0700) SpO2:  [89 %-95 %] 91 % (11/13 0720) Weight:  [207 lb 3.7 oz (94 kg)] 207 lb 3.7 oz (94 kg) (11/13 0600)  Hemodynamic parameters for last 24 hours:    Intake/Output from previous day: 11/12 0701 - 11/13 0700 In: 1240 [P.O.:1240] Out: 1830 [Urine:1830] Intake/Output this shift:    Scattered rhonchi  Lab Results:  Basename 02/01/11 0505 01/31/11 0415  WBC 11.0* 11.0*  HGB 9.6* 10.2*  HCT 28.4* 30.2*  PLT 126* 110*   BMET:  Basename 02/01/11 0505 01/31/11 0415  NA 134* 135  K 4.0 3.9  CL 100 102  CO2 25 24  GLUCOSE 125* 116*  BUN 17 19  CREATININE 0.94 0.97  CALCIUM 8.6 8.4    PT/INR:  Basename 01/29/11 1530  LABPROT 16.0*  INR 1.25   ABG    Component Value Date/Time   PHART 7.406 01/29/2011 2252   HCO3 24.0 01/29/2011 2252   TCO2 23 01/30/2011 1638   ACIDBASEDEF 3.0* 01/29/2011 2012   O2SAT 92.0 01/29/2011 2252   CBG (last 3)   Basename 02/01/11 0732 01/31/11 2118 01/31/11 1913  GLUCAP 118* 142* 136*    Assessment/Plan: S/P Procedure(s) (LRB): CORONARY ARTERY BYPASS GRAFTING (CABG)TIMES FOUR (N/A) Plan for discharge: see discharge orders   LOS: 5 days    VAN TRIGT III,PETER 02/01/2011   Will treat with 7 days po Avelox for supsected pulmonary infection (bronchopneumonia)

## 2011-02-02 ENCOUNTER — Encounter (HOSPITAL_COMMUNITY): Payer: Self-pay | Admitting: Cardiothoracic Surgery

## 2011-02-02 ENCOUNTER — Inpatient Hospital Stay (HOSPITAL_COMMUNITY): Payer: Medicare Other

## 2011-02-02 LAB — GLUCOSE, CAPILLARY
Glucose-Capillary: 128 mg/dL — ABNORMAL HIGH (ref 70–99)
Glucose-Capillary: 109 mg/dL — ABNORMAL HIGH (ref 70–99)
Glucose-Capillary: 127 mg/dL — ABNORMAL HIGH (ref 70–99)

## 2011-02-02 MED ORDER — ASPIRIN EC 81 MG PO TBEC: 81.0000 mg | DELAYED_RELEASE_TABLET | ORAL | Status: DC

## 2011-02-02 MED ORDER — CLOPIDOGREL BISULFATE 75 MG PO TABS: 75.0000 mg | ORAL_TABLET | Freq: Every day | ORAL | Status: DC

## 2011-02-02 MED ORDER — FUROSEMIDE 40 MG PO TABS: 40.0000 mg | ORAL_TABLET | Freq: Every day | ORAL | Status: DC

## 2011-02-02 MED ORDER — MOXIFLOXACIN HCL 400 MG PO TABS: 400.0000 mg | ORAL_TABLET | Freq: Every day | ORAL | Status: AC

## 2011-02-02 MED ORDER — POTASSIUM CHLORIDE CRYS ER 20 MEQ PO TBCR: 20.0000 meq | EXTENDED_RELEASE_TABLET | Freq: Every day | ORAL | Status: DC

## 2011-02-02 MED ORDER — ATORVASTATIN CALCIUM 10 MG PO TABS: 10.0000 mg | ORAL_TABLET | Freq: Every day | ORAL | Status: DC

## 2011-02-02 MED ORDER — METOPROLOL TARTRATE 12.5 MG HALF TABLET: 12.5000 mg | ORAL_TABLET | Freq: Two times a day (BID) | ORAL | Status: DC

## 2011-02-02 MED ORDER — LEVALBUTEROL TARTRATE 45 MCG/ACT IN AERO: 2.0000 | INHALATION_SPRAY | Freq: Four times a day (QID) | RESPIRATORY_TRACT | Status: DC

## 2011-02-02 MED ORDER — OXYCODONE HCL 5 MG PO TABS: 5.0000 mg | ORAL_TABLET | ORAL | Status: AC | PRN

## 2011-02-02 NOTE — Progress Notes (Signed)
Pt ambulated 4105ft (second amb of day), pt steady gait, 2L 02. Will continue to monitor.  Ninetta Lights 02/02/2011 7:49 PM

## 2011-02-02 NOTE — Discharge Summary (Signed)
Discharge Summary  Russell Patton March 10, 1941 70 y.o. [id number]  01/27/2011    Admitting Diagnosis: Shortness of breath [786.05] ACUTE CORONARY SYNDROME chest pain CAD left main disease  Discharge Diagnosis:  Patient Active Problem List  Diagnoses  . COLONIC POLYPS, HX OF  . DOE (dyspnea on exertion)  . Bradycardia, severe sinus  . HTN (hypertension)  . Dyslipidemia, goal LDL below 100  . Cervical spine disease  . CAD (coronary artery disease), native coronary artery  . S/P CABG x 4    Procedures: Cardiac catheterization  Coronary artery bypass grafting x4 (left internal mammary artery to the LAD, saphenous vein graft to the first diagonal saphenous vein graft to the circumflex marginal, saphenous vein graft to posterior descending) Endoscopic vein harvest right leg   HPI:  The patient is a 70 y.o. male with no prior history of coronary artery disease. He reports history of increasing shortness of breath over the past several years but denies any history of chest pain. He was referred to Dr. Rennis Golden for further evaluation and underwent a stress test on the date of this admission which was notable for diffuse EKG changes, severe shortness of breath and decreased LV function with stress despite in a regional perfusion abnormalities on nuclear imaging.Because of these findings, he was admitted for further cardiology workup.   Hospital Course:  The patient was admitted to [facility]   On the date of this admission and ultimately ruled out for myocardial infarction. He underwent cardiac catheterization by Dr. Nicholaus Bloom on 01/28/2011. Cardiac catheterization revealed a 90% critical distal left main stenosis with a 95% proximal right coronary artery lesion. LV function was within normal limits.Because of his critical left main stenosis, an urgent cardiac surgery consultation was requested while the patient was in the cath lab. Dr. Tressie Stalker saw the patient and reviewed his films. Because  of his critical left main lesion it was felt the patient would require surgical revascularization urgently, however he was stable with no chest pain and it was felt that surgery could be delayed til the following morning.Dr. Donata Clay had an opening on his operative schedule and saw the patient, agreeing with Dr. Barry Dienes previous consult..  All risks, benefits and alternatives of surgery were explained in detail, and the patient agreed to proceed.  He remained stable and pain-free prior to surgery. The patient was taken to the operating room and underwent coronary artery bypass grafting x4. Please see previously dictated operative report for complete details of surgery. The postoperative course was notable for productive cough with elevated white blood cell count 15,000. He was started empirically on by mouth Avelox for a suspected bronchopneumonia.His condition has continued to improve over his postoperative course. He currently requires one to 2 L of supplemental oxygen but this is being weaned. His white blood cell count is trended downward, now 11,000.he has remained afebrile and all vital signs have been stable. He is ambulating the halls without problem and is tolerating regular diet. He is being diuresed for postoperative volume overload. He is maintaining sinus rhythm.  Labs:  [facility] 02/01/11 0505 01/31/11 0415  NA 134* 135  K 4.0 3.9  CL 100 102  CO2 25 24  GLUCOSE 125* 116*  BUN 17 19  CALCIUM 8.6 8.4    Basename 02/01/11 0505 01/31/11 0415  WBC 11.0* 11.0*  HGB 9.6* 10.2*  HCT 28.4* 30.2*  PLT 126* 110*   No results found for this basename: INR:2 in the last 72 hours  Discharge Medications:  Russell, Patton  Home Medication Instructions ZOX:[id number]   Printed on:02/02/11 1150  Medication Information                    clopidogrel (PLAVIX) 75 MG tablet Take 1 tablet (75 mg total) by mouth daily.           furosemide (LASIX) 40 MG tablet Take 1 tablet (40 mg total) by  mouth daily.           levalbuterol (XOPENEX HFA) 45 MCG/ACT inhaler Inhale 2 puffs into the lungs 4 (four) times daily.           metoprolol tartrate (LOPRESSOR) 12.5 mg TABS Take 0.5 tablets (12.5 mg total) by mouth 2 (two) times daily.           moxifloxacin (AVELOX) 400 MG tablet Take 1 tablet (400 mg total) by mouth daily.           oxyCODONE (OXY IR/ROXICODONE) 5 MG immediate release tablet Take 1-2 tablets (5-10 mg total) by mouth every 3 (three) hours as needed for pain.           potassium chloride SA (K-DUR,KLOR-CON) 20 MEQ tablet Take 1 tablet (20 mEq total) by mouth daily.           atorvastatin (LIPITOR) 10 MG tablet Take 1 tablet (10 mg total) by mouth daily.           aspirin EC 81 MG tablet Take 1 tablet (81 mg total) by mouth every morning.              Discharge Instructions:  The patient is asked to refrain from driving, heavy lifting or strenuous activity.  Continue ambulating daily and use of incentive spirometer.  Shower daily and clean incisions with soap and water.   Discharge Followup: he will need to make an appointment to see Dr. Nicholaus Bloom in 2 weeks. He will see Dr. Zenaida Niece Trigt's PA in 3 weeks with a chest x-ray.  Ajla Mcgeachy H 02/02/2011, 11:50 AM         Addendum:  Pt was seen by cardiology and will be started on an ACE-I at D/C.

## 2011-02-02 NOTE — Progress Notes (Signed)
1400  EPW and CTS removed/pulled per protocol and as ordered.  EPW pulled, no ectopy/bleeding noted, pt tolerated well. CTS removed, steris applied. INR 1.25, NSR. Pt reminded to lie supine approximately one hour. Will continue to monitor.  Ninetta Lights 02/02/2011 2:07 PM

## 2011-02-02 NOTE — Plan of Care (Signed)
Problem: Phase III Progression Outcomes Goal: Discharge plan remains appropriate-arrangements made Outcome: Progressing Plan d/c home 02/03/11

## 2011-02-02 NOTE — Plan of Care (Signed)
Problem: Phase II Progression Outcomes Goal: Tolerates weaning with O2 Sat > 90 Outcome: Not Progressing Pt desat to 84% on ra. 2L 02 re-applied

## 2011-02-02 NOTE — Progress Notes (Signed)
CARDIAC REHAB PHASE I   PRE:  Rate/Rhythm: 99 SR  BP:  Supine:   Sitting:   Standing: 110/60   SaO2: 95 2L 84 RA  MODE:  Ambulation: 550 ft   POST:  Rate/Rhythem: 97   BP:  Supine:   Sitting: 120/70  Standing:    SaO2: 88 2L during walk 92 2L after walk 1207-1240  O2 d/c prior to walk in room sat RA down to 84%. O2 replaced and walked on 2L. Gait steady without walker. To chair after walk, family present.  He agrees to [employer]. CRP in [place],will send referral.  Beatrix Fetters

## 2011-02-02 NOTE — Progress Notes (Signed)
4 Days Post-Op Procedure(s) (LRB): CORONARY ARTERY BYPASS GRAFTING (CABG)TIMES FOUR (N/A) Subjective: Feels "much better today".  Breathing improving, with decreased cough.    Objective: Vital signs in last 24 hours: Temp:  [98.3 F (36.8 C)-99.1 F (37.3 C)] 98.3 F (36.8 C) (11/14 0604) Pulse Rate:  [86-96] 88  (11/14 0604) Cardiac Rhythm:  [-] Normal sinus rhythm (11/13 2200) Resp:  [14-24] 20  (11/14 0604) BP: (112-123)/(63-75) 113/65 mmHg (11/14 0604) SpO2:  [92 %-97 %] 96 % (11/14 0604) FiO2 (%):  [4 %] 4 % (11/13 1514) Weight:  [206 lb 8 oz (93.668 kg)] 206 lb 8 oz (93.668 kg) (11/14 0609)  Hemodynamic parameters for last 24 hours:    Intake/Output from previous day: 11/13 0701 - 11/14 0700 In: 1160 [P.O.:1160] Out: 1125 [Urine:1125] Intake/Output this shift:    General appearance: no distress Heart: regular rate and rhythm Lungs: diminished breath sounds bibasilar Extremities: edema mild right LE Wound: Clean,dry, no erythema  Lab Results:  [facility] 02/01/11 0505 01/31/11 0415  WBC 11.0* 11.0*  HGB 9.6* 10.2*  HCT 28.4* 30.2*  PLT 126* 110*   BMET:  Basename 02/01/11 0505 01/31/11 0415  NA 134* 135  K 4.0 3.9  CL 100 102  CO2 25 24  GLUCOSE 125* 116*  BUN 17 19  CREATININE 0.94 0.97  CALCIUM 8.6 8.4    PT/INR: No results found for this basename: LABPROT,INR in the last 72 hours ABG    Component Value Date/Time   PHART 7.406 01/29/2011 2252   HCO3 24.0 01/29/2011 2252   TCO2 23 01/30/2011 1638   ACIDBASEDEF 3.0* 01/29/2011 2012   O2SAT 92.0 01/29/2011 2252   CBG (last 3)   Basename 02/02/11 0603 02/01/11 2049 02/01/11 1646  GLUCAP 128* 95 169*   CXR with decreased atelectasis, stable bibasilar effusions  Assessment/Plan: S/P Procedure(s) (LRB): CORONARY ARTERY BYPASS GRAFTING (CABG)TIMES FOUR (N/A) Mobilize Continue diuresis for volume overload Avelox for presumed bronchitis Still on 2L O2, but has weaned down from 4L.  Continue IS.   Monitor for possible need for home O2. Hopefully home 1-2 days if remains stable. Maintaining sinus rhythm.   LOS: 6 days    Russell Patton H 02/02/2011

## 2011-02-02 NOTE — Plan of Care (Signed)
Problem: Phase III Progression Outcomes Goal: Discharge plan remains appropriate-arrangements made Outcome: Progressing Plan to d/c 02/03/11

## 2011-02-02 NOTE — Progress Notes (Signed)
Subjective:  No CP/SOB  Objective:  Temp:  [98.3 F (36.8 C)-98.4 F (36.9 C)] 98.3 F (36.8 C) (11/14 0604) Pulse Rate:  [86-88] 88  (11/14 0604) Resp:  [20] 20  (11/14 0604) BP: (113-114)/(65-73) 113/65 mmHg (11/14 0604) SpO2:  [92 %-96 %] 93 % (11/14 0842) FiO2 (%):  [4 %] 4 % (11/13 1514) Weight:  [93.668 kg (206 lb 8 oz)] 206 lb 8 oz (93.668 kg) (11/14 0609) Weight change: -0.332 kg (-11.7 oz)  Intake/Output from previous day: 11/13 0701 - 11/14 0700 In: 1160 [P.O.:1160] Out: 1125 [Urine:1125]  Intake/Output from this shift:    Physical Exam: General appearance: alert and cooperative Neck: no adenopathy, no carotid bruit, no JVD, supple, symmetrical, trachea midline and thyroid not enlarged, symmetric, no tenderness/mass/nodules Lungs: clear to auscultation bilaterally Heart: regular rate and rhythm, S1, S2 normal, no murmur, click, rub or gallop Extremities: edema 1 + pitting Skin: Skin color, texture, turgor normal. No rashes or lesions  Lab Results: Results for orders placed during the hospital encounter of 01/27/11 (from the past 48 hour(s))  GLUCOSE, CAPILLARY     Status: Abnormal   Collection Time   01/31/11  3:32 PM      Component Value Range Comment   Glucose-Capillary 125 (*) 70 - 99 (mg/dL)    Comment 1 Documented in Chart      Comment 2 Notify RN     GLUCOSE, CAPILLARY     Status: Abnormal   Collection Time   01/31/11  7:13 PM      Component Value Range Comment   Glucose-Capillary 136 (*) 70 - 99 (mg/dL)    Comment 1 Documented in Chart      Comment 2 Notify RN     GLUCOSE, CAPILLARY     Status: Abnormal   Collection Time   01/31/11  9:18 PM      Component Value Range Comment   Glucose-Capillary 142 (*) 70 - 99 (mg/dL)    Comment 1 Documented in Chart      Comment 2 Notify RN     CBC     Status: Abnormal   Collection Time   02/01/11  5:05 AM      Component Value Range Comment   WBC 11.0 (*) 4.0 - 10.5 (K/uL)    RBC 3.14 (*) 4.22 - 5.81  (MIL/uL)    Hemoglobin 9.6 (*) 13.0 - 17.0 (g/dL)    HCT 16.1 (*) 09.6 - 52.0 (%)    MCV 90.4  78.0 - 100.0 (fL)    MCH 30.6  26.0 - 34.0 (pg)    MCHC 33.8  30.0 - 36.0 (g/dL)    RDW 04.5  40.9 - 81.1 (%)    Platelets 126 (*) 150 - 400 (K/uL)   BASIC METABOLIC PANEL     Status: Abnormal   Collection Time   02/01/11  5:05 AM      Component Value Range Comment   Sodium 134 (*) 135 - 145 (mEq/L)    Potassium 4.0  3.5 - 5.1 (mEq/L)    Chloride 100  96 - 112 (mEq/L)    CO2 25  19 - 32 (mEq/L)    Glucose, Bld 125 (*) 70 - 99 (mg/dL)    BUN 17  6 - 23 (mg/dL)    Creatinine, Ser 9.14  0.50 - 1.35 (mg/dL)    Calcium 8.6  8.4 - 10.5 (mg/dL)    GFR calc non Af Amer 82 (*) >90 (mL/min)    GFR  calc Af Amer >90  >90 (mL/min)   GLUCOSE, CAPILLARY     Status: Abnormal   Collection Time   02/01/11  7:32 AM      Component Value Range Comment   Glucose-Capillary 118 (*) 70 - 99 (mg/dL)    Comment 1 Documented in Chart      Comment 2 Notify RN     GLUCOSE, CAPILLARY     Status: Abnormal   Collection Time   02/01/11 11:49 AM      Component Value Range Comment   Glucose-Capillary 126 (*) 70 - 99 (mg/dL)    Comment 1 Documented in Chart      Comment 2 Notify RN     GLUCOSE, CAPILLARY     Status: Abnormal   Collection Time   02/01/11  4:46 PM      Component Value Range Comment   Glucose-Capillary 169 (*) 70 - 99 (mg/dL)   GLUCOSE, CAPILLARY     Status: Normal   Collection Time   02/01/11  8:49 PM      Component Value Range Comment   Glucose-Capillary 95  70 - 99 (mg/dL)   GLUCOSE, CAPILLARY     Status: Abnormal   Collection Time   02/02/11  6:03 AM      Component Value Range Comment   Glucose-Capillary 128 (*) 70 - 99 (mg/dL)   GLUCOSE, CAPILLARY     Status: Abnormal   Collection Time   02/02/11 11:28 AM      Component Value Range Comment   Glucose-Capillary 109 (*) 70 - 99 (mg/dL)    Comment 1 Notify RN       Imaging: Imaging results have been reviewed  Assessment/Plan:    1. Principal Problem: 2.  *DOE (dyspnea on exertion) 3. Active Problems: 4.  Bradycardia, severe sinus 5.  HTN (hypertension) 6.  Dyslipidemia, goal LDL below 100 7.  Cervical spine disease 8.  CAD (coronary artery disease), native coronary artery 9.  S/P CABG x 4 10.   Time Spent Directly with Patient:  20  minutes  Length of Stay:  LOS: 6 days   Pt is POD #4 CABG x 4 for LM/3VD. In NSR. Exam benign. Labs ok. Ambulating w/o difficulty. Prob home next 1-2 days per TCTS. ROV with Dr. Tresa Endo.Runell Gess 02/02/2011, 1:20 PM

## 2011-02-03 MED ORDER — LISINOPRIL 10 MG PO TABS: 10.0000 mg | ORAL_TABLET | Freq: Every day | ORAL | Status: DC

## 2011-02-03 NOTE — Progress Notes (Signed)
[facility]  Subjective: No complaints other than the bed being uncomfortable.  Objective: Vital signs in last 24 hours: Temp:  [98 F (36.7 C)-98.4 F (36.9 C)] 98.4 F (36.9 C) (11/15 1610) Pulse Rate:  [78-91] 80  (11/15 0608) Resp:  [18-20] 20  (11/15 0608) BP: (101-127)/(62-75) 115/69 mmHg (11/15 0608) SpO2:  [93 %-98 %] 93 % (11/15 0853) Weight:  [92.942 kg (204 lb 14.4 oz)] 204 lb 14.4 oz (92.942 kg) (11/15 9604) Last BM Date: 02/03/11  Intake/Output from previous day: 11/14 0701 - 11/15 0700 In: 840 [P.O.:840] Out: 550 [Urine:550] Intake/Output this shift:    Medications Current Facility-Administered Medications  Medication Dose Route Frequency Provider Last Rate Last Dose  . acetaminophen (TYLENOL) tablet 650 mg  650 mg Oral Q6H PRN Kathlee Nations Trigt III, MD   650 mg at 02/01/11 1755  . ALPRAZolam Prudy Feeler) tablet 0.25 mg  0.25 mg Oral Q6H PRN Kathlee Nations Trigt III, MD      . alum & mag hydroxide-simeth (MAALOX PLUS) 400-400-40 MG/5ML suspension 15-30 mL  15-30 mL Oral Q4H PRN Kathlee Nations Trigt III, MD      . bisacodyl (DULCOLAX) EC tablet 10 mg  10 mg Oral Daily PRN Kathlee Nations Trigt III, MD       Or  . bisacodyl (DULCOLAX) suppository 10 mg  10 mg Rectal Daily PRN Kathlee Nations Trigt III, MD   10 mg at 02/02/11 1752  . clopidogrel (PLAVIX) tablet 75 mg  75 mg Oral Q breakfast Kathlee Nations Trigt III, MD   75 mg at 02/03/11 0647  . docusate sodium (COLACE) capsule 200 mg  200 mg Oral Daily Kathlee Nations Trigt III, MD   200 mg at 02/03/11 5409  . furosemide (LASIX) tablet 40 mg  40 mg Oral Daily Kathlee Nations Trigt III, MD   40 mg at 02/03/11 0647  . guaiFENesin (MUCINEX) 12 hr tablet 600 mg  600 mg Oral Q12H PRN Kathlee Nations Trigt III, MD      . insulin aspart (novoLOG) injection 0-24 Units  0-24 Units Subcutaneous TID AC & HS Kerin Perna III, MD   2 Units at 02/02/11 1749  . levalbuterol (XOPENEX HFA) inhaler 2 puff  2 puff Inhalation QID Loreli Slot, MD   2 puff at 02/03/11 [phone]  . magnesium hydroxide (MILK OF MAGNESIA) suspension 30 mL  30 mL Oral Daily PRN Kathlee Nations Trigt III, MD      . metoprolol tartrate (LOPRESSOR) tablet 12.5 mg  12.5 mg Oral BID Lennette Bihari, MD   12.5 mg at 02/03/11 0647  . moxifloxacin (AVELOX) tablet 400 mg  400 mg Oral q1800 Kathlee Nations Trigt III, MD   400 mg at 02/02/11 1749  . mupirocin (BACTROBAN) 2 % ointment 1 application  1 application Nasal BID Lisette Abu Hilty   1 application at 02/02/11 2256  . oxyCODONE (Oxy IR/ROXICODONE) immediate release tablet 5-10 mg  5-10 mg Oral Q3H PRN Kathlee Nations Trigt III, MD   10 mg at 02/02/11 0610  . pantoprazole (PROTONIX) EC tablet 40 mg  40 mg Oral QAC breakfast Kathlee Nations Trigt III, MD   40 mg at 02/02/11 1149  . potassium chloride SA (K-DUR,KLOR-CON) CR tablet 20 mEq  20 mEq Oral BID Kathlee Nations Trigt III, MD   20 mEq at 02/03/11 0647  . povidone-iodine (BETADINE) 10 % external solution 1 application  1 application Topical BID Kathlee Nations Suann Larry, MD  1 application at 02/02/11 2200  . promethazine (PHENERGAN) injection 12.5 mg  12.5 mg Intravenous Q6H PRN Kathlee Nations Trigt III, MD      . rosuvastatin (CRESTOR) tablet 20 mg  20 mg Oral q1800 Kathlee Nations Trigt III, MD   20 mg at 02/02/11 1749  . sodium chloride 0.9 % injection 3 mL  3 mL Intravenous Q12H Kathlee Nations Trigt III, MD   3 mL at 02/02/11 2258  . traMADol (ULTRAM) tablet 50-100 mg  50-100 mg Oral Q4H PRN Kerin Perna III, MD        PE: General appearance: alert, cooperative and no distress Lungs: clear to auscultation bilaterally Heart: regular rate and rhythm, S1, S2 normal, no murmur, click, rub or gallop Extremities: no LEE  Lab Results:   Basename 02/01/11 0505  WBC 11.0*  HGB 9.6*  HCT 28.4*  PLT 126*   BMET  Basename 02/01/11 0505  NA 134*  K 4.0  CL 100  CO2 25  GLUCOSE 125*  BUN 17  CREATININE 0.94  CALCIUM 8.6   PT/INR No results found for this basename: LABPROT:3,INR:3 in the  last 72 hours  Studies/Results:  Assessment/Plan  Principal Problem:   *S/P CABG x 4:  LIMA to LAD, saphenous vein graft to first diagonal, saphenous vein graft to   circumflex marginal, saphenous vein graft to posterior descending (01/30/2011)  Active Problems:  CAD (coronary artery disease), native coronary artery  DOE (dyspnea on exertion)  Bradycardia, severe sinus   HTN (hypertension)  Dyslipidemia, goal LDL below 100  Cervical spine disease    Plan: HR/BP controlled and stable. Plavix, lasix, lopressor, crestor, K+. Home today. FU with Dr. Rennis Golden   LOS: 7 days    HAGER,BRYAN W 02/03/2011 9:40 AM  Patient seen and examined. Agree with assessment and plan. Will start ACE-I initially at low dose and titrate as outpatient. Cardiac rehabilitation. KELLY,Kenta A 02/03/2011 1:37 PM

## 2011-02-03 NOTE — Progress Notes (Signed)
UR Completed.  Russell Patton [name] 02/03/2011  

## 2011-02-03 NOTE — Progress Notes (Signed)
Cardiac Rehab [phone] Education completed with pt and wife. Pt to walk on O2 at 2L as he has been doing with [place]. Understanding voiced.Janett Kamath DunlapRN

## 2011-02-03 NOTE — Progress Notes (Signed)
PT FOR DISCHARGE HOME TODAY; LOW OXYGEN SATS WITH AMBULATION.  WILL NEED HOME O2. HOME OXYGEN ARRANGED WITH [facility].  PORTABLE TANK TO BE DELIVERED TO ROOM PRIOR TO DC HOME.

## 2011-02-03 NOTE — Progress Notes (Signed)
5 Days Post-Op  Procedure(s) (LRB): CORONARY ARTERY BYPASS GRAFTING (CABG)TIMES FOUR (N/A) Subjective: SOB  Objective  Telemetry  Temp:  [98.2 F (36.8 C)-98.4 F (36.9 C)] 98.4 F (36.9 C) (11/15 1914) Pulse Rate:  [78-89] 80  (11/15 0608) Resp:  [18-20] 20  (11/15 0608) BP: (101-127)/(62-75) 115/69 mmHg (11/15 0608) SpO2:  [93 %-98 %] 93 % (11/15 0853) Weight:  [204 lb 14.4 oz (92.942 kg)] 204 lb 14.4 oz (92.942 kg) (11/15 7829)   Intake/Output Summary (Last 24 hours) at 02/03/11 1338 Last data filed at 02/03/11 0821  Gross per 24 hour  Intake    600 ml  Output    300 ml  Net    300 ml         Lab Results:  Basename 02/01/11 0505  NA 134*  K 4.0  CL 100  CO2 25  GLUCOSE 125*  BUN 17  CREATININE 0.94  CALCIUM 8.6  MG --  PHOS --   No results found for this basename: AST:2,ALT:2,ALKPHOS:2,BILITOT:2,PROT:2,ALBUMIN:2 in the last 72 hours No results found for this basename: LIPASE:2,AMYLASE:2 in the last 72 hours  Basename 02/01/11 0505  WBC 11.0*  NEUTROABS --  HGB 9.6*  HCT 28.4*  MCV 90.4  PLT 126*   No results found for this basename: CKTOTAL:4,CKMB:4,TROPONINI:4 in the last 72 hours No results found for this basename: POCBNP:3 in the last 72 hours No results found for this basename: DDIMER in the last 72 hours No results found for this basename: HGBA1C in the last 72 hours No results found for this basename: CHOL,HDL,LDLCALC,TRIG,CHOLHDL in the last 72 hours No results found for this basename: TSH,T4TOTAL,FREET3,T3FREE,THYROIDAB in the last 72 hours No results found for this basename: VITAMINB12,FOLATE,FERRITIN,TIBC,IRON,RETICCTPCT in the last 72 hours  Medications: Scheduled    . clopidogrel  75 mg Oral Q breakfast  . docusate sodium  200 mg Oral Daily  . furosemide  40 mg Oral Daily  . insulin aspart  0-24 Units Subcutaneous TID AC & HS  . levalbuterol  2 puff Inhalation QID  . metoprolol tartrate  12.5 mg Oral BID  . moxifloxacin  400  mg Oral q1800  . mupirocin ointment  1 application Nasal BID  . pantoprazole  40 mg Oral QAC breakfast  . potassium chloride  20 mEq Oral BID  . povidone-iodine  1 application Topical BID  . rosuvastatin  20 mg Oral q1800  . sodium chloride  3 mL Intravenous Q12H     Radiology/Studies:  Dg Chest 2 View  02/02/2011  *RADIOLOGY REPORT*  Clinical Data: Shortness of breath.  Recent CABG.  CHEST - 2 VIEW  Comparison: 02/01/2011  Findings: Heart size and vascularity are normal.  No pneumothorax. Chronic interstitial lung disease.  Tiny bilateral pleural effusions.  IMPRESSION: Tiny bilateral effusions.  Chronic interstitial lung disease.  Original Report Authenticated By: Gwynn Burly, M.D.    INR: Will add last result for INR, ABG once components are confirmed Will add last 4 CBG results once components are confirmed  Assessment/Plan: S/P Procedure(s) (LRB): CORONARY ARTERY BYPASS GRAFTING (CABG)TIMES FOUR (N/A) The patient will require short term home oxygen for hypoxia during the post op period with O2 saturations in the mid 80's on room air   LOS: 7 days    Russell Patton 11/15/20121:38 PM

## 2011-02-03 NOTE — Progress Notes (Signed)
5 Days Post-Op Procedure(s) (LRB): CORONARY ARTERY BYPASS GRAFTING (CABG)TIMES FOUR (N/A) Subjective: Less SOB, and decreased cough.  Sputum mostly clear.  Desats to 85-89% with exertion, still requires 2L O2.  Wants to go home.  Objective: Vital signs in last 24 hours: Temp:  [98 F (36.7 C)-98.4 F (36.9 C)] 98.4 F (36.9 C) (11/15 0608) Pulse Rate:  [78-91] 80  (11/15 0608) Cardiac Rhythm:  [-] Normal sinus rhythm (11/15 0115) Resp:  [18-20] 20  (11/15 0608) BP: (101-127)/(62-75) 115/69 mmHg (11/15 0608) SpO2:  [93 %-98 %] 96 % (11/15 0608) Weight:  [204 lb 14.4 oz (92.942 kg)] 204 lb 14.4 oz (92.942 kg) (11/15 2130)  Hemodynamic parameters for last 24 hours:    Intake/Output from previous day: 11/14 0701 - 11/15 0700 In: 840 [P.O.:840] Out: 550 [Urine:550] Intake/Output this shift:    General appearance: no distress Heart: regular rate and rhythm Lungs: diminished breath sounds bilaterally Extremities: edema mild bilateral LE Wound: Clean, dry, no erythema  Lab Results:  [facility] 02/01/11 0505  WBC 11.0*  HGB 9.6*  HCT 28.4*  PLT 126*   BMET:  Basename 02/01/11 0505  NA 134*  K 4.0  CL 100  CO2 25  GLUCOSE 125*  BUN 17  CREATININE 0.94  CALCIUM 8.6    PT/INR: No results found for this basename: LABPROT,INR in the last 72 hours ABG    Component Value Date/Time   PHART 7.406 01/29/2011 2252   HCO3 24.0 01/29/2011 2252   TCO2 23 01/30/2011 1638   ACIDBASEDEF 3.0* 01/29/2011 2012   O2SAT 92.0 01/29/2011 2252   CBG (last 3)   Basename 02/03/11 0605 02/02/11 2109 02/02/11 1619  GLUCAP 114* 103* 127*    Assessment/Plan: S/P Procedure(s) (LRB): CORONARY ARTERY BYPASS GRAFTING (CABG)TIMES FOUR (N/A) Plan for discharge: see discharge orders Will arrange home O2.   LOS: 7 days    Russell Patton 02/03/2011

## 2011-02-08 ENCOUNTER — Encounter (HOSPITAL_COMMUNITY): Payer: Self-pay | Admitting: Cardiothoracic Surgery

## 2011-02-17 ENCOUNTER — Other Ambulatory Visit: Payer: Self-pay | Admitting: Cardiothoracic Surgery

## 2011-02-17 DIAGNOSIS — I2510 Atherosclerotic heart disease of native coronary artery without angina pectoris: Secondary | ICD-10-CM

## 2011-02-23 ENCOUNTER — Ambulatory Visit
Admission: RE | Admit: 2011-02-23 | Discharge: 2011-02-23 | Disposition: A | Discharge status: Home / Self Care | Payer: Medicare Other | Source: Ambulatory Visit | Attending: Cardiothoracic Surgery | Admitting: Cardiothoracic Surgery

## 2011-02-23 ENCOUNTER — Encounter: Payer: Self-pay | Admitting: *Deleted

## 2011-02-23 ENCOUNTER — Ambulatory Visit (INDEPENDENT_AMBULATORY_CARE_PROVIDER_SITE_OTHER): Payer: Self-pay | Admitting: Physician Assistant

## 2011-02-23 VITALS — BP 118/72 | HR 76 | Resp 20 | Ht 67.0 in | Wt 197.0 lb

## 2011-02-23 DIAGNOSIS — I2510 Atherosclerotic heart disease of native coronary artery without angina pectoris: Secondary | ICD-10-CM

## 2011-02-23 DIAGNOSIS — Z951 Presence of aortocoronary bypass graft: Secondary | ICD-10-CM

## 2011-02-23 NOTE — Progress Notes (Signed)
  HPI: Patient returns for routine postoperative follow-up having undergone coronary artery bypass grafting x4 by Dr. Donata Clay on 01/29/2011. The patient's early postoperative recovery while in the hospital was notable for hypoxia which was felt to be related to a preoperative bronchitis. He was treated with Avelox and was discharged home with 2 L of supplemental oxygen.  He has since completed his course of antibiotics and his cough is resolved. The home health nurse has monitored his O2 sats and he has not required oxygen in the last 2 weeks. He is having minimal pain and denies any shortness of breath or lower extremity edema. He saw Dr. Rennis Golden last week and Dr. Rennis Golden feels that he may be maintained on aspirin alone at this point and Plavix may be discontinued. He is eating well and resting well and is otherwise stable.  Current Outpatient Prescriptions  Medication Sig Dispense Refill  . aspirin EC 81 MG tablet Take 1 tablet (81 mg total) by mouth every morning.  30 tablet  1  . atorvastatin (LIPITOR) 10 MG tablet Take 1 tablet (10 mg total) by mouth daily.  30 tablet  1  . clopidogrel (PLAVIX) 75 MG tablet Take 1 tablet (75 mg total) by mouth daily.  30 tablet  1  . lisinopril (PRINIVIL) 10 MG tablet Take 1 tablet (10 mg total) by mouth daily.  30 tablet  1  . metoprolol tartrate (LOPRESSOR) 12.5 mg TABS Take 0.5 tablets (12.5 mg total) by mouth 2 (two) times daily.  60 tablet  1    Physical Exam: Blood pressure 118/72, pulse 76, respirations 20, O2 sat 94% Sternal and right lower extremity EVH incisions have all healed well. Heart regular rate and rhythm without murmurs rubs or gallops Lungs clear to auscultation Extremities no edema  Diagnostic Tests: Chest x-ray shows resolved pleural effusions  Impression: The patient is doing well status post CABG.  Plan: On exam today in the office his O2 sats are 94% on Room air. We will contact the home health agency and discontinue his home  O2. From our standpoint it is fine to discontinue the Plavix and continue on aspirin therapy. He will followup as directed with Dr. Rennis Golden.  We will see him back when necessary. At this point he may begin driving and slowly increase his activity as tolerated.

## 2011-02-28 ENCOUNTER — Encounter: Payer: Self-pay | Admitting: *Deleted

## 2011-03-01 MED FILL — Sodium Chloride IV Soln 0.9%: INTRAVENOUS | Qty: 1000 | Status: AC

## 2011-03-01 MED FILL — Heparin Sodium (Porcine) Inj 1000 Unit/ML: INTRAMUSCULAR | Qty: 60 | Status: AC

## 2011-03-01 MED FILL — Electrolyte-R (PH 7.4) Solution: INTRAVENOUS | Qty: 4000 | Status: AC

## 2011-03-01 MED FILL — Sodium Chloride Irrigation Soln 0.9%: Qty: 3000 | Status: AC

## 2011-05-19 ENCOUNTER — Ambulatory Visit (INDEPENDENT_AMBULATORY_CARE_PROVIDER_SITE_OTHER): Payer: Medicare Other | Admitting: Gastroenterology

## 2011-05-19 VITALS — BP 131/72 | HR 80 | Temp 98.2°F | Ht 69.0 in | Wt 205.0 lb

## 2011-05-19 DIAGNOSIS — K219 Gastro-esophageal reflux disease without esophagitis: Secondary | ICD-10-CM | POA: Insufficient documentation

## 2011-05-19 DIAGNOSIS — K921 Melena: Secondary | ICD-10-CM | POA: Insufficient documentation

## 2011-05-19 DIAGNOSIS — K6289 Other specified diseases of anus and rectum: Secondary | ICD-10-CM | POA: Insufficient documentation

## 2011-05-19 DIAGNOSIS — Z800 Family history of malignant neoplasm of digestive organs: Secondary | ICD-10-CM | POA: Insufficient documentation

## 2011-05-19 DIAGNOSIS — Z86010 Personal history of colonic polyps: Secondary | ICD-10-CM

## 2011-05-19 LAB — HEMOCCULT GUIAC POC 1CARD (OFFICE): Fecal Occult Blood, POC: NEGATIVE

## 2011-05-19 MED ORDER — LIDOCAINE-HYDROCORTISONE ACE 3-1 % RE KIT: 1.0000 "application " | PACK | Freq: Two times a day (BID) | RECTAL | Status: DC

## 2011-05-19 MED ORDER — PEG-KCL-NACL-NASULF-NA ASC-C 100 G PO SOLR: 1.0000 | Freq: Once | ORAL | Status: DC

## 2011-05-19 MED ORDER — HYDROCORTISONE 2.5 % RE CREA: TOPICAL_CREAM | Freq: Two times a day (BID) | RECTAL | Status: AC

## 2011-05-19 NOTE — Patient Instructions (Signed)
We have scheduled you for an upper endoscopy and colonoscopy. Please see separate instructions. Please start anorectal cream twice daily for rectal pain. RX sent to [facility]. Please have your lab work done.   Anal Fissure, Adult An anal fissure is a small tear or crack in the skin around the opening of the butt (anus).Bleeding from the tear or crack usually stops on its own within a few minutes. The bleeding may happen every time you poop until the tear or crack heals. HOME CARE  Eat lots of fruit, whole grains, and vegetables. Avoid foods like bananas and dairy products. These foods can make it hard to poop.   Take a warm water bath (sitz bath) as told by your doctor.   Drink enough fluids to keep your pee (urine) clear or pale yellow.   Only take medicines as told by your doctor.    Do not use numbing creams or hydrocortisone cream on the area. These creams can slow healing.  GET HELP RIGHT AWAY IF:  Your tear or crack is not healed in 3 days.   You have more bleeding.   You have a fever.   You have watery poop (diarrhea) mixed with blood.   You have pain.   You are getting worse, not better.  MAKE SURE YOU:   Understand these instructions.   Will watch your condition.   Will get help right away if you are not doing well or get worse.  Document Released: 11/03/2010 Document Revised: 11/17/2010 Document Reviewed: 11/03/2010 [facility] Patient Information 2012 [place] and [place], [place].  Gastroesophageal Reflux Disease, Adult Gastroesophageal reflux disease (GERD) happens when acid from your stomach goes into your food pipe (esophagus). The acid can cause a burning feeling in your chest. Over time, the acid can make small holes (ulcers) in your food pipe.  HOME CARE  Ask your doctor for advice about:   Losing weight.   Quitting smoking.   Alcohol use.   Avoid foods and drinks that make your problems worse. You may want to avoid:   Caffeine and alcohol.   Chocolate.   Mints.    Garlic and onions.   Spicy foods.   Citrus fruits, such as oranges, lemons, or limes.   Foods that contain tomato, such as sauce, chili, salsa, and pizza.   Fried and fatty foods.   Avoid lying down for 3 hours before you go to bed or before you take a nap.   Eat small meals often, instead of large meals.   Wear loose-fitting clothing. Do not wear anything tight around your waist.   Raise (elevate) the head of your bed 6 to 8 inches with wood blocks. Using extra pillows does not help.   Only take medicines as told by your doctor.   Do not take aspirin or ibuprofen.  GET HELP RIGHT AWAY IF:   You have pain in your arms, neck, jaw, teeth, or back.   Your pain gets worse or changes.   You feel sick to your stomach (nauseous), throw up (vomit), or sweat (diaphoresis).   You feel short of breath, or you pass out (faint).   Your throw up is green, yellow, black, or looks like coffee grounds or blood.   Your poop (stool) is red, bloody, or black.  MAKE SURE YOU:   Understand these instructions.   Will watch your condition.   Will get help right away if you are not doing well or get worse.  Document Released: 08/24/2007 Document Revised: 11/17/2010 Document  Reviewed: 09/24/2010 [facility] Patient Information 2012 [place], [place].

## 2011-05-19 NOTE — Progress Notes (Signed)
Faxed to PCP

## 2011-05-19 NOTE — Progress Notes (Signed)
Primary Care Physician:  FUSCO,LAWRENCE J., MD, MD  Primary Gastroenterologist:  Michael Rourk, MD   Chief Complaint  Patient presents with  . Melena    HPI:  Russell Patton is a 71 y.o. male here with complaints of black stool. Symptoms began about one week ago. After a couple of days it cleared up but came back again a few days ago. Denies associated lightheadedness, weakness. States he took Pepto-Bismol after the first couple episodes of black stools. BM regular. No abdominal pain. Tried OTC suppositories for rectal pain. Also complains of new onset heartburn, belching. Started about month or so ago. Worse with meals. No dysphagia. No weight loss. No prior EGD. His last colonoscopy was in 2008. Has a family history of colon cancer, father at age 75. Personal history of adenomatous colonic polyps   Current Outpatient Prescriptions  Medication Sig Dispense Refill  . aspirin EC 81 MG tablet Take 1 tablet (81 mg total) by mouth every morning.  30 tablet  1  . lisinopril (PRINIVIL) 10 MG tablet Take 1 tablet (10 mg total) by mouth daily.  30 tablet  1  . metoprolol tartrate (LOPRESSOR) 12.5 mg TABS Take 0.5 tablets (12.5 mg total) by mouth 2 (two) times daily.  60 tablet  1  . Multiple Vitamin (MULTIVITAMIN) capsule Take 1 capsule by mouth daily.        Allergies as of 05/19/2011  . (No Known Allergies)    Past Medical History  Diagnosis Date  . Hypertension   . Carotid artery stenosis   . Hyperlipidemia   . Coronary artery disease   . 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 Arthritis   . Smoker     quit  . History of adenomatous polyp of colon 2005/2008    Past Surgical History  Procedure Date  . Appendectomy   . Cervical disc surgery   . Coronary artery bypass graft 01/29/2011    Procedure: CORONARY ARTERY BYPASS GRAFTING (CABG)TIMES FOUR;  Surgeon: Peter Van Trigt III, MD;  Location: [facility] OR;  Service: Open Heart Surgery;  Laterality: N/A;  using left internal mammary artery and  endoscopically harvested right saphenous vein  . Coronary artery bypass graft 01/29/2011    Procedure: CORONARY ARTERY BYPASS GRAFTING (CABG)TIMES THREE;  Surgeon: Peter Van Trigt III, MD;  Location: [facility] OR;  Service: Open Heart Surgery;  Laterality: N/A;  . Colonoscopy 01/15/07    normal rectum/cecal/descending colon polyps. adenomatous polyp  . Colonoscopy 10/20/03    normal rectum/sigmoid diverticula, polyps at splenic flexure and at cecum, adenomatous polyp    Family History  Problem Relation Age of Onset  . Cancer Father 75    colon  . Heart attack Father   . Dementia Mother     History   Social History  . Marital Status: Married    Spouse Name: N/A    Number of Children: N/A  . Years of Education: N/A   Occupational History  . Not on file.   Social History Main Topics  . Smoking status: Former Smoker    Types: Cigarettes    Quit date: 03/21/2001  . Smokeless tobacco: Not on file  . Alcohol Use: 0.0 oz/week    0 Cans of beer per week  . Drug Use: No  . Sexually Active: Yes   Other Topics Concern  . Not on file   Social History Narrative  . No narrative on file      ROS:  General: Negative for anorexia, weight loss,   fever, chills, fatigue, weakness. Eyes: Negative for vision changes.  ENT: Negative for hoarseness, difficulty swallowing , nasal congestion. CV: Negative for chest pain, angina, palpitations, dyspnea on exertion, peripheral edema.  Respiratory: Negative for dyspnea at rest. Some dyspnea on exertion. No cough, sputum, wheezing.  GI: See history of present illness. GU:  Negative for dysuria, hematuria, urinary incontinence, urinary frequency, nocturnal urination.  MS: Negative for joint pain, low back pain.  Derm: Negative for rash or itching.  Neuro: Negative for weakness, abnormal sensation, seizure, frequent headaches, memory loss, confusion.  Psych: Negative for anxiety, depression, suicidal ideation, hallucinations.  Endo: Negative for  unusual weight change.  Heme: Negative for bruising or bleeding. Allergy: Negative for rash or hives.    Physical Examination:  BP 131/72  Pulse 80  Temp(Src) 98.2 F (36.8 C) (Temporal)  Ht 5' 9" (1.753 m)  Wt 205 lb (92.987 kg)  BMI 30.27 kg/m2   General: Well-nourished, well-developed in no acute distress.  Head: Normocephalic, atraumatic.   Eyes: Conjunctiva pink, no icterus. Mouth: Oropharyngeal mucosa moist and pink , no lesions erythema or exudate. Neck: Supple without thyromegaly, masses, or lymphadenopathy.  Lungs: Clear to auscultation bilaterally.  Heart: Regular rate and rhythm, no murmurs rubs or gallops.  Abdomen: Bowel sounds are normal, nontender, nondistended, no hepatosplenomegaly or masses, no abdominal bruits or    hernia , no rebound or guarding.   Rectal: small skin tags externally. Tenderness noted at anterior anal opening. Stool, brown and Hemoccult negative. Internal exam limited due to to patient's discomfort. Extremities: No lower extremity edema. No clubbing or deformities.  Neuro: Alert and oriented x 4 , grossly normal neurologically.  Skin: Warm and dry, no rash or jaundice.   Psych: Alert and cooperative, normal mood and affect.   Imaging Studies: No results found.    

## 2011-05-19 NOTE — Assessment & Plan Note (Addendum)
Based on history and exam, suspect anorectal fissure. Initially given AnaMantle HC forte however received call from pharmacy that it was constipation over $300. Therefore, Anusol prescribed to use per rectal twice a day for 2 weeks. Colonoscopy as planned both for rectal pain, family history of colon cancer, personal history of colonic adenomatous polyps.  I have discussed the risks, alternatives, benefits with regards to but not limited to the risk of reaction to medication, bleeding, infection, perforation and the patient is agreeable to proceed. Written consent to be obtained.  Hand out provided for fissure.

## 2011-05-19 NOTE — Assessment & Plan Note (Signed)
Clinically patient has not had significant bleed. ?real melena. Check CBC. EGD as planned for new onset heartburn and ?melena.  I have discussed the risks, alternatives, benefits with regards to but not limited to the risk of reaction to medication, bleeding, infection, perforation and the patient is agreeable to proceed. Written consent to be obtained.

## 2011-05-20 LAB — CBC WITH DIFFERENTIAL/PLATELET
Basophils Relative: 1 % (ref 0–1)
Eosinophils Absolute: 0.3 10*3/uL (ref 0.0–0.7)
HCT: 47.1 % (ref 39.0–52.0)
Hemoglobin: 15.7 g/dL (ref 13.0–17.0)
Lymphs Abs: 2.8 10*3/uL (ref 0.7–4.0)
MCH: 28.6 pg (ref 26.0–34.0)
MCHC: 33.3 g/dL (ref 30.0–36.0)
MCV: 85.8 fL (ref 78.0–100.0)
Monocytes Absolute: 0.8 10*3/uL (ref 0.1–1.0)
Monocytes Relative: 11 % (ref 3–12)
Neutrophils Relative %: 49 % (ref 43–77)
RBC: 5.49 MIL/uL (ref 4.22–5.81)

## 2011-05-23 NOTE — Progress Notes (Signed)
Quick Note:  Cbc looks good. Stool test negative for blood. Procedures as planned. ______

## 2011-05-26 ENCOUNTER — Other Ambulatory Visit: Payer: Self-pay | Admitting: Gastroenterology

## 2011-05-26 ENCOUNTER — Telehealth: Payer: Self-pay | Admitting: Gastroenterology

## 2011-05-26 DIAGNOSIS — K921 Melena: Secondary | ICD-10-CM

## 2011-05-26 MED ORDER — PEG 3350-KCL-NA BICARB-NACL 420 G PO SOLR: ORAL | Status: AC

## 2011-05-26 NOTE — Telephone Encounter (Signed)
Up to him. When he was here in the office he reported black stools and new onset heartburn.

## 2011-05-26 NOTE — Telephone Encounter (Signed)
Pts wife called- she stated that pt did not want to proceed with the EGD-only wants to do the TCS- She stated that he is not having problems with heartburn-

## 2011-06-02 ENCOUNTER — Encounter (HOSPITAL_COMMUNITY): Payer: Self-pay | Admitting: Pharmacy Technician

## 2011-06-07 MED ORDER — SODIUM CHLORIDE 0.45 % IV SOLN
Freq: Once | INTRAVENOUS | Status: AC
  Administered 2011-06-08: 08:00:00 via INTRAVENOUS

## 2011-06-08 ENCOUNTER — Encounter (HOSPITAL_COMMUNITY): Payer: Self-pay

## 2011-06-08 ENCOUNTER — Ambulatory Visit (HOSPITAL_COMMUNITY): Admission: RE | Admit: 2011-06-08 | Payer: Medicare Other | Source: Ambulatory Visit | Admitting: Internal Medicine

## 2011-06-08 ENCOUNTER — Encounter (HOSPITAL_COMMUNITY)
Admission: RE | Disposition: A | Discharge status: Home / Self Care | Payer: Self-pay | Source: Ambulatory Visit | Attending: Internal Medicine

## 2011-06-08 ENCOUNTER — Encounter (HOSPITAL_COMMUNITY): Admission: RE | Payer: Self-pay | Source: Ambulatory Visit

## 2011-06-08 ENCOUNTER — Ambulatory Visit (HOSPITAL_COMMUNITY)
Admission: RE | Admit: 2011-06-08 | Discharge: 2011-06-08 | Disposition: A | Discharge status: Home / Self Care | Payer: Medicare Other | Source: Ambulatory Visit | Attending: Internal Medicine | Admitting: Internal Medicine

## 2011-06-08 DIAGNOSIS — I10 Essential (primary) hypertension: Secondary | ICD-10-CM | POA: Insufficient documentation

## 2011-06-08 DIAGNOSIS — Z1211 Encounter for screening for malignant neoplasm of colon: Secondary | ICD-10-CM

## 2011-06-08 DIAGNOSIS — D126 Benign neoplasm of colon, unspecified: Secondary | ICD-10-CM

## 2011-06-08 DIAGNOSIS — K5730 Diverticulosis of large intestine without perforation or abscess without bleeding: Secondary | ICD-10-CM

## 2011-06-08 DIAGNOSIS — Z79899 Other long term (current) drug therapy: Secondary | ICD-10-CM | POA: Insufficient documentation

## 2011-06-08 DIAGNOSIS — Z7982 Long term (current) use of aspirin: Secondary | ICD-10-CM | POA: Insufficient documentation

## 2011-06-08 DIAGNOSIS — Z860100 Personal history of colon polyps, unspecified: Secondary | ICD-10-CM | POA: Insufficient documentation

## 2011-06-08 DIAGNOSIS — Z800 Family history of malignant neoplasm of digestive organs: Secondary | ICD-10-CM | POA: Insufficient documentation

## 2011-06-08 DIAGNOSIS — K921 Melena: Secondary | ICD-10-CM

## 2011-06-08 DIAGNOSIS — E785 Hyperlipidemia, unspecified: Secondary | ICD-10-CM | POA: Insufficient documentation

## 2011-06-08 HISTORY — PX: COLONOSCOPY: SHX5424

## 2011-06-08 SURGERY — COLONOSCOPY
Anesthesia: Moderate Sedation

## 2011-06-08 SURGERY — COLONOSCOPY WITH ESOPHAGOGASTRODUODENOSCOPY (EGD)
Anesthesia: Moderate Sedation

## 2011-06-08 MED ORDER — MEPERIDINE HCL 100 MG/ML IJ SOLN
INTRAMUSCULAR | Status: DC | PRN
  Administered 2011-06-08: 25 mg via INTRAVENOUS
  Administered 2011-06-08: 50 mg via INTRAVENOUS

## 2011-06-08 MED ORDER — STERILE WATER FOR IRRIGATION IR SOLN
Status: DC | PRN
  Administered 2011-06-08: 08:00:00

## 2011-06-08 MED ORDER — MIDAZOLAM HCL 5 MG/5ML IJ SOLN
INTRAMUSCULAR | Status: AC
  Filled 2011-06-08: qty 10

## 2011-06-08 MED ORDER — MIDAZOLAM HCL 5 MG/5ML IJ SOLN
INTRAMUSCULAR | Status: DC | PRN
  Administered 2011-06-08: 2 mg via INTRAVENOUS
  Administered 2011-06-08: 1 mg via INTRAVENOUS
  Administered 2011-06-08: 2 mg via INTRAVENOUS

## 2011-06-08 MED ORDER — MEPERIDINE HCL 100 MG/ML IJ SOLN
INTRAMUSCULAR | Status: AC
  Filled 2011-06-08: qty 2

## 2011-06-08 NOTE — Discharge Instructions (Addendum)
Diverticulosis Diverticulosis is a common condition that develops when small pouches (diverticula) form in the wall of the colon. The risk of diverticulosis increases with age. It happens more often in people who eat a low-fiber diet. Most individuals with diverticulosis have no symptoms. Those individuals with symptoms usually experience abdominal pain, constipation, or loose stools (diarrhea). HOME CARE INSTRUCTIONS  Increase the amount of fiber in your diet as directed by your caregiver or dietician. This may reduce symptoms of diverticulosis.  Your caregiver may recommend taking a dietary fiber supplement.  Drink at least 6 to 8 glasses of water each day to prevent constipation.  Try not to strain when you have a bowel movement.  Your caregiver may recommend avoiding nuts and seeds to prevent complications, although this is still an uncertain benefit.  Only take over-the-counter or prescription medicines for pain, discomfort, or fever as directed by your caregiver.  FOODS WITH HIGH FIBER CONTENT INCLUDE: Fruits. Apple, peach, pear, tangerine, raisins, prunes.  Vegetables. Brussels sprouts, asparagus, broccoli, cabbage, carrot, cauliflower, romaine lettuce, spinach, summer squash, tomato, winter squash, zucchini.  Starchy Vegetables. Baked beans, kidney beans, lima beans, split peas, lentils, potatoes (with skin).  Grains. Whole wheat bread, brown rice, bran flake cereal, plain oatmeal, white rice, shredded wheat, bran muffins.  SEEK IMMEDIATE MEDICAL CARE IF:  You develop increasing pain or severe bloating.  You have an oral temperature above 102 F (38.9 C), not controlled by medicine.  You develop vomiting or bowel movements that are bloody or black.  Document Released: 12/03/2003 Document Revised: 02/24/2011 Document Reviewed: 08/05/2009 [facility] Patient Information 2012 [place], [place].   Diverticulosis and polyp information provided.  Further recommendations to follow pending  review of pathology report.  Colonoscopy Care After Read the instructions outlined below and refer to this sheet in the next few weeks. These discharge instructions provide you with general information on caring for yourself after you leave the hospital. Your doctor may also give you specific instructions. While your treatment has been planned according to the most current medical practices available, unavoidable complications occasionally occur. If you have any problems or questions after discharge, call your doctor. HOME CARE INSTRUCTIONS ACTIVITY:  You may resume your regular activity, but move at a slower pace for the next 24 hours.   Take frequent rest periods for the next 24 hours.   Walking will help get rid of the air and reduce the bloated feeling in your belly (abdomen).   No driving for 24 hours (because of the medicine (anesthesia) used during the test).   You may shower.   Do not sign any important legal documents or operate any machinery for 24 hours (because of the anesthesia used during the test).  NUTRITION:  Drink plenty of fluids.   You may resume your normal diet as instructed by your doctor.   Begin with a light meal and progress to your normal diet. Heavy or fried foods are harder to digest and may make you feel sick to your stomach (nauseated).   Avoid alcoholic beverages for 24 hours or as instructed.  MEDICATIONS:  You may resume your normal medications unless your doctor tells you otherwise.  WHAT TO EXPECT TODAY:  Some feelings of bloating in the abdomen.   Passage of more gas than usual.   Spotting of blood in your stool or on the toilet paper.  IF YOU HAD POLYPS REMOVED DURING THE COLONOSCOPY:  No aspirin products for 7 days or as instructed.   No alcohol for  7 days or as instructed.   Eat a soft diet for the next 24 hours.  FINDING OUT THE RESULTS OF YOUR TEST Not all test results are available during your visit. If your test results are not  back during the visit, make an appointment with your caregiver to find out the results. Do not assume everything is normal if you have not heard from your caregiver or the medical facility. It is important for you to follow up on all of your test results.  SEEK IMMEDIATE MEDICAL CARE IF:  You have more than a spotting of blood in your stool.   Your belly is swollen (abdominal distention).   You are nauseated or vomiting.   You have a fever.   You have abdominal pain or discomfort that is severe or gets worse throughout the day.  Document Released: 10/20/2003 Document Revised: 02/24/2011 Document Reviewed: 10/18/2007 [facility] Patient Information 2012 [place], [place].

## 2011-06-08 NOTE — Op Note (Signed)
[facility] [address] [place], [place]  [address]  COLONOSCOPY PROCEDURE REPORT  PATIENT:  Russell, Patton  MR#:  [id number] BIRTHDATE:  1940/05/09, 70 yrs. old  GENDER:  male ENDOSCOPIST:  R. Roetta Sessions, MD FACP [facility] REF. BY:  Artis Delay, M.D. PROCEDURE DATE:  06/08/2011 PROCEDURE:  Colonoscopy with biopsy  INDICATIONS:  personal history colonic polyps;positive family history colon cancer  INFORMED CONSENT:  The risks, benefits, alternatives and imponderables including but not limited to bleeding, perforation as well as the possibility of a missed lesion have been reviewed. The potential for biopsy, lesion removal, etc. have also been discussed.  Questions have been answered.  All parties agreeable. Please see the history and physical in the medical record for more information.  MEDICATIONS:  Versed 5 mg IV and Demerol 75 mg IV in divided doses.  DESCRIPTION OF PROCEDURE:  After a digital rectal exam was performed, the EC-[id number] ([id number]) colonoscope was advanced from the anus through the rectum and colon to the area of the cecum, ileocecal valve and appendiceal orifice.  The cecum was deeply intubated.  These structures were well-seen and photographed for the record.  From the level of the cecum and ileocecal valve, the scope was slowly and cautiously withdrawn.  The mucosal surfaces were carefully surveyed utilizing scope tip deflection to facilitate fold flattening as needed.  The scope was pulled down into the rectum where a thorough examination including retroflexion was performed. <<PROCEDUREIMAGES>>  FINDINGS:  adequate preparation. Normal rectum. Pancolonic diverticulosis. Single diminutive polyp at the splenic flexure. The remainder of the           colonic mucosa appeared normal.  THERAPEUTIC / DIAGNOSTIC MANEUVERS PERFORMED:    the above-mentioned diminutive polyp was cold biopsied/removed.  COMPLICATIONS:  none  CECAL WITHDRAWAL TIME:12  minutes  IMPRESSION:     Colonic polyp-removed as described above. Colonic diverticulosis.  RECOMMENDATIONS:        Follow up with pathology  ______________________________ R. Roetta Sessions, MD Caleen Essex  CC:  Artis Delay, M.D.  n. eSIGNED:   R. Roetta Sessions at 06/08/2011 08:59 AM  Marden Noble, [phone]

## 2011-06-08 NOTE — H&P (View-Only) (Signed)
Primary Care Physician:  Cassell Smiles., MD, MD  Primary Gastroenterologist:  Roetta Sessions, MD   Chief Complaint  Patient presents with  . Melena    HPI:  Russell Patton is a 71 y.o. male here with complaints of black stool. Symptoms began about one week ago. After a couple of days it cleared up but came back again a few days ago. Denies associated lightheadedness, weakness. States he took Pepto-Bismol after the first couple episodes of black stools. BM regular. No abdominal pain. Tried OTC suppositories for rectal pain. Also complains of new onset heartburn, belching. Started about month or so ago. Worse with meals. No dysphagia. No weight loss. No prior EGD. His last colonoscopy was in 2008. Has a family history of colon cancer, father at age 55. Personal history of adenomatous colonic polyps   Current Outpatient Prescriptions  Medication Sig Dispense Refill  . aspirin EC 81 MG tablet Take 1 tablet (81 mg total) by mouth every morning.  30 tablet  1  . lisinopril (PRINIVIL) 10 MG tablet Take 1 tablet (10 mg total) by mouth daily.  30 tablet  1  . metoprolol tartrate (LOPRESSOR) 12.5 mg TABS Take 0.5 tablets (12.5 mg total) by mouth 2 (two) times daily.  60 tablet  1  . Multiple Vitamin (MULTIVITAMIN) capsule Take 1 capsule by mouth daily.        Allergies as of 05/19/2011  . (No Known Allergies)    Past Medical History  Diagnosis Date  . Hypertension   . Carotid artery stenosis   . Hyperlipidemia   . Coronary artery disease   . 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 Arthritis   . Smoker     quit  . History of adenomatous polyp of colon 2005/2008    Past Surgical History  Procedure Date  . Appendectomy   . Cervical disc surgery   . Coronary artery bypass graft 01/29/2011    Procedure: CORONARY ARTERY BYPASS GRAFTING (CABG)TIMES FOUR;  Surgeon: Kathlee Nations Trigt III, MD;  Location: [facility] OR;  Service: Open Heart Surgery;  Laterality: N/A;  using left internal mammary artery and  endoscopically harvested right saphenous vein  . Coronary artery bypass graft 01/29/2011    Procedure: CORONARY ARTERY BYPASS GRAFTING (CABG)TIMES THREE;  Surgeon: Mikey Bussing, MD;  Location: [facility] OR;  Service: Open Heart Surgery;  Laterality: N/A;  . Colonoscopy 01/15/07    normal rectum/cecal/descending colon polyps. adenomatous polyp  . Colonoscopy 10/20/03    normal rectum/sigmoid diverticula, polyps at splenic flexure and at cecum, adenomatous polyp    Family History  Problem Relation Age of Onset  . Cancer Father 46    colon  . Heart attack Father   . Dementia Mother     History   Social History  . Marital Status: Married    Spouse Name: N/A    Number of Children: N/A  . Years of Education: N/A   Occupational History  . Not on file.   Social History Main Topics  . Smoking status: Former Smoker    Types: Cigarettes    Quit date: 03/21/2001  . Smokeless tobacco: Not on file  . Alcohol Use: 0.0 oz/week    0 Cans of beer per week  . Drug Use: No  . Sexually Active: Yes   Other Topics Concern  . Not on file   Social History Narrative  . No narrative on file      ROS:  General: Negative for anorexia, weight loss,  fever, chills, fatigue, weakness. Eyes: Negative for vision changes.  ENT: Negative for hoarseness, difficulty swallowing , nasal congestion. CV: Negative for chest pain, angina, palpitations, dyspnea on exertion, peripheral edema.  Respiratory: Negative for dyspnea at rest. Some dyspnea on exertion. No cough, sputum, wheezing.  GI: See history of present illness. GU:  Negative for dysuria, hematuria, urinary incontinence, urinary frequency, nocturnal urination.  MS: Negative for joint pain, low back pain.  Derm: Negative for rash or itching.  Neuro: Negative for weakness, abnormal sensation, seizure, frequent headaches, memory loss, confusion.  Psych: Negative for anxiety, depression, suicidal ideation, hallucinations.  Endo: Negative for  unusual weight change.  Heme: Negative for bruising or bleeding. Allergy: Negative for rash or hives.    Physical Examination:  BP 131/72  Pulse 80  Temp(Src) 98.2 F (36.8 C) (Temporal)  Ht 5\' 9"  (1.753 m)  Wt 205 lb (92.987 kg)  BMI 30.27 kg/m2   General: Well-nourished, well-developed in no acute distress.  Head: Normocephalic, atraumatic.   Eyes: Conjunctiva pink, no icterus. Mouth: Oropharyngeal mucosa moist and pink , no lesions erythema or exudate. Neck: Supple without thyromegaly, masses, or lymphadenopathy.  Lungs: Clear to auscultation bilaterally.  Heart: Regular rate and rhythm, no murmurs rubs or gallops.  Abdomen: Bowel sounds are normal, nontender, nondistended, no hepatosplenomegaly or masses, no abdominal bruits or    hernia , no rebound or guarding.   Rectal: small skin tags externally. Tenderness noted at anterior anal opening. Stool, brown and Hemoccult negative. Internal exam limited due to to patient's discomfort. Extremities: No lower extremity edema. No clubbing or deformities.  Neuro: Alert and oriented x 4 , grossly normal neurologically.  Skin: Warm and dry, no rash or jaundice.   Psych: Alert and cooperative, normal mood and affect.   Imaging Studies: No results found.

## 2011-06-08 NOTE — Interval H&P Note (Signed)
History and Physical Interval Note:  06/08/2011 8:19 AM  Russell Patton  has presented today for surgery, with the diagnosis of melena  The various methods of treatment have been discussed with the patient and family. After consideration of risks, benefits and other options for treatment, the patient has consented to  Procedure(s) (LRB): COLONOSCOPY (N/A) as a surgical intervention .  The patients' history has been reviewed, patient examined, no change in status, stable for surgery.  I have reviewed the patients' chart and labs.  Questions were answered to the patient's satisfaction.     Eula Listen  Patient's wife called back and stated no melena or heartburn. They declined to have an EGD. Today I interviewed the patient and confirmed heartburn is not a problem. Had black stools only with Pepto-Bismol. Does not want evaluation of his upper GI tract at this time. He seemed to understand the risk. Consequently, we'll proceed with a surveillance colonoscopy given history of polyps in positive family history of colon cancer.

## 2011-06-10 ENCOUNTER — Encounter (HOSPITAL_COMMUNITY): Payer: Self-pay | Admitting: Internal Medicine

## 2011-06-12 ENCOUNTER — Encounter: Payer: Self-pay | Admitting: Internal Medicine

## 2012-04-29 IMAGING — CR DG CHEST 1V PORT
1 series · 1 of 1 positions shown · non-contrast
Comparison: 01/29/2011

CLINICAL DATA: Postop CABG

PORTABLE CHEST - 1 VIEW

[AP]
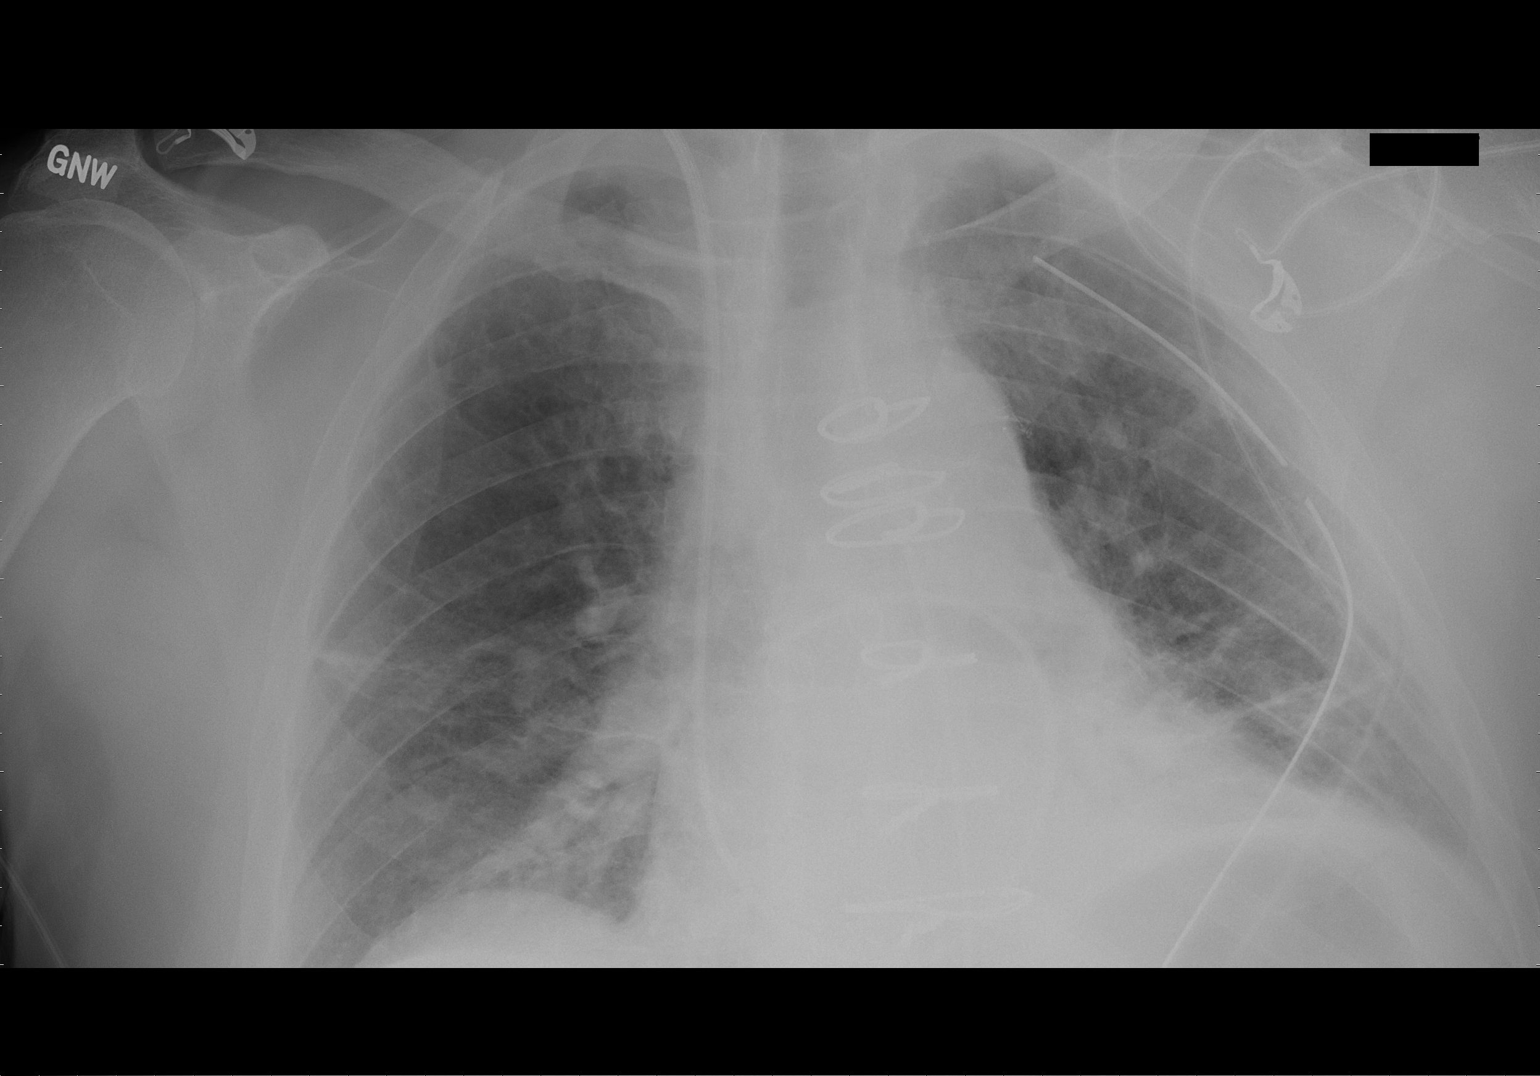

[1 of 1 positions shown; findings below may reference images not displayed]

FINDINGS: Interval extubation and removal of enteric tube.

Stable right IJ Yannet venous catheter, left chest tube, and
mediastinal drain.

Chronic interstitial markings and/or mild interstitial edema.
Bilateral lower lobe atelectasis.  No pneumothorax.

The heart is top normal in size. Postsurgical changes related to
prior CABG.

Cervical spine fixation hardware.
IMPRESSION: Interval extubation and removal of enteric tube.

Otherwise stable support apparatus.

Chronic interstitial markings and/or interstitial edema.  No
pneumothorax.

## 2012-05-23 IMAGING — CR DG CHEST 2V
2 series · 2 of 2 positions shown · non-contrast
Comparison: 02/02/2011

CLINICAL DATA: Cardiac surgery 3 weeks ago.  Follow-up.

CHEST - 2 VIEW

[w chest pa]
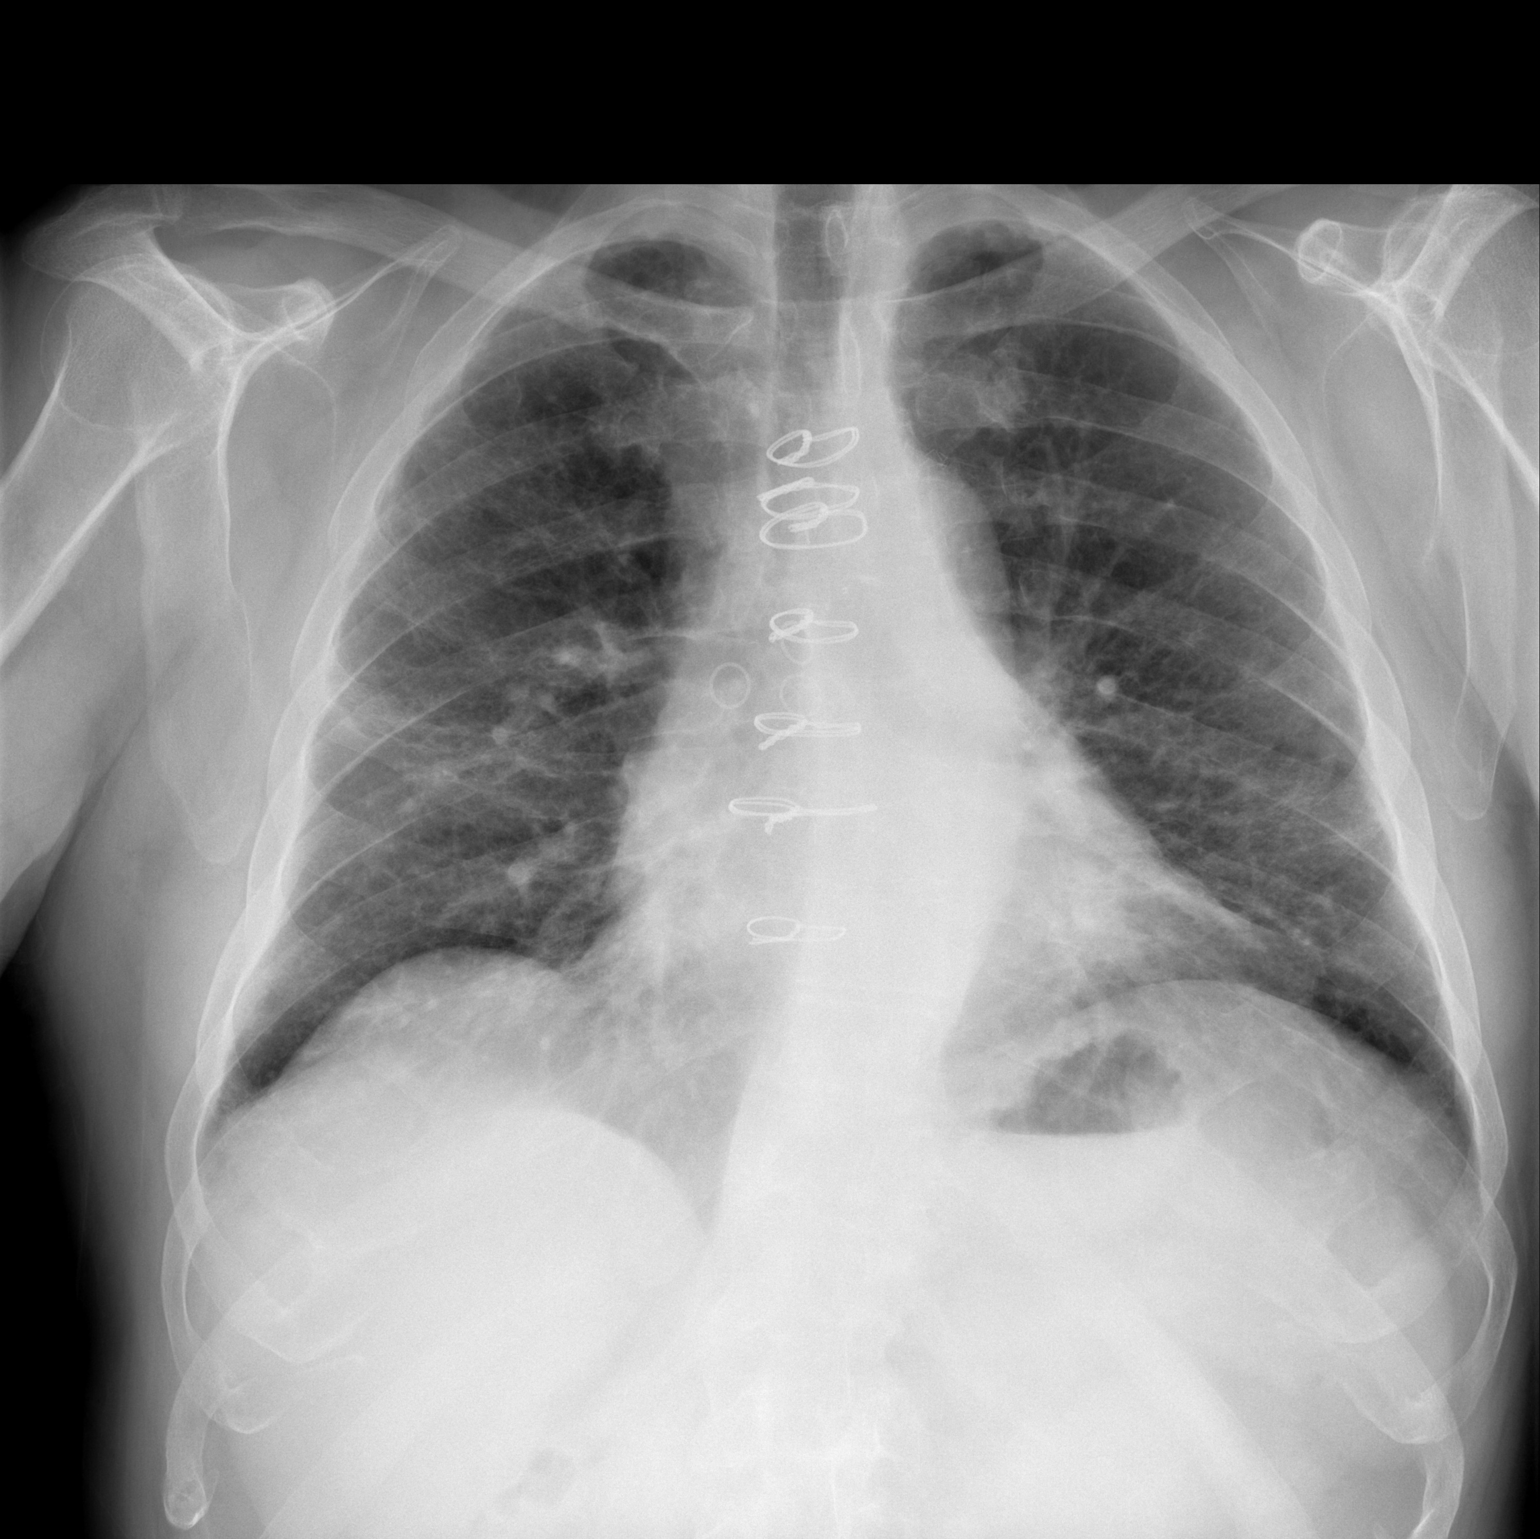

[w chest lat]
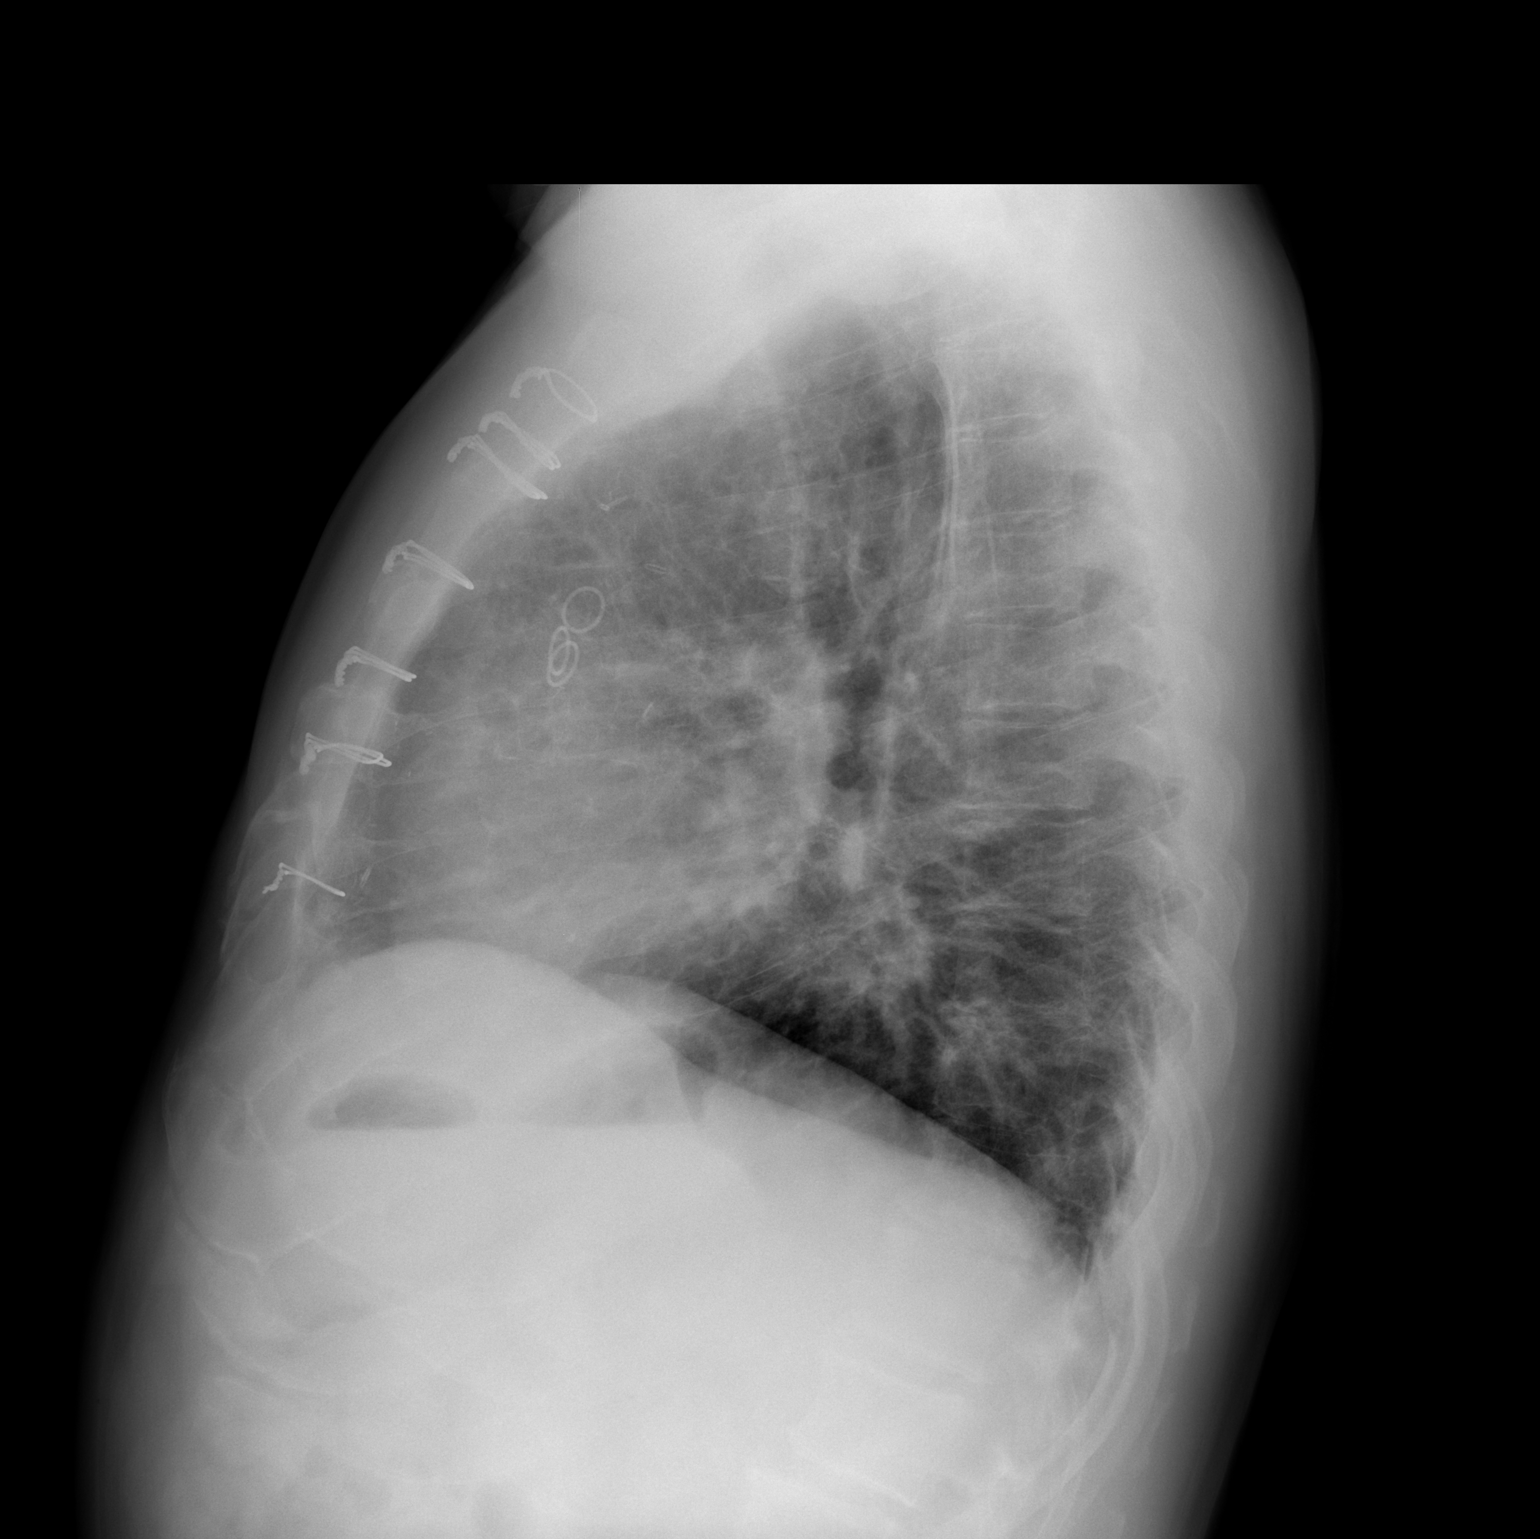

[2 of 2 positions shown; findings below may reference images not displayed]

FINDINGS: Heart is mildly enlarged. Interstitial changes have
improved likely due to resolved interstitial edema.  Pleural
effusions have resolved.  Bibasilar atelectasis or scar is noted.
IMPRESSION: Improved edema.

Pleural effusions have resolved.

## 2012-05-30 ENCOUNTER — Other Ambulatory Visit (HOSPITAL_COMMUNITY): Payer: Self-pay | Admitting: Family Medicine

## 2012-05-30 ENCOUNTER — Ambulatory Visit (HOSPITAL_COMMUNITY)
Admission: RE | Admit: 2012-05-30 | Discharge: 2012-05-30 | Disposition: A | Discharge status: Home / Self Care | Payer: Medicare Other | Source: Ambulatory Visit | Attending: Family Medicine | Admitting: Family Medicine

## 2012-05-30 ENCOUNTER — Other Ambulatory Visit (HOSPITAL_COMMUNITY): Payer: Self-pay | Admitting: Cardiovascular Disease

## 2012-05-30 DIAGNOSIS — R079 Chest pain, unspecified: Secondary | ICD-10-CM | POA: Insufficient documentation

## 2012-05-30 DIAGNOSIS — Z77090 Contact with and (suspected) exposure to asbestos: Secondary | ICD-10-CM | POA: Insufficient documentation

## 2012-05-30 DIAGNOSIS — R0602 Shortness of breath: Secondary | ICD-10-CM | POA: Insufficient documentation

## 2012-05-30 DIAGNOSIS — J8410 Pulmonary fibrosis, unspecified: Secondary | ICD-10-CM | POA: Insufficient documentation

## 2012-06-08 ENCOUNTER — Ambulatory Visit (HOSPITAL_COMMUNITY)
Admission: RE | Admit: 2012-06-08 | Discharge: 2012-06-08 | Disposition: A | Discharge status: Home / Self Care | Payer: Medicare Other | Source: Ambulatory Visit | Attending: Internal Medicine | Admitting: Internal Medicine

## 2012-06-08 ENCOUNTER — Other Ambulatory Visit: Payer: Self-pay | Admitting: Internal Medicine

## 2012-06-08 ENCOUNTER — Encounter (HOSPITAL_COMMUNITY): Payer: Self-pay | Admitting: Pharmacy Technician

## 2012-06-08 ENCOUNTER — Ambulatory Visit (HOSPITAL_COMMUNITY)
Admission: RE | Admit: 2012-06-08 | Discharge: 2012-06-08 | Disposition: A | Discharge status: Home / Self Care | Payer: Medicare Other | Source: Ambulatory Visit | Attending: Cardiovascular Disease | Admitting: Cardiovascular Disease

## 2012-06-08 DIAGNOSIS — R079 Chest pain, unspecified: Secondary | ICD-10-CM

## 2012-06-08 DIAGNOSIS — R0602 Shortness of breath: Secondary | ICD-10-CM

## 2012-06-08 DIAGNOSIS — I25810 Atherosclerosis of coronary artery bypass graft(s) without angina pectoris: Secondary | ICD-10-CM | POA: Insufficient documentation

## 2012-06-08 DIAGNOSIS — Z951 Presence of aortocoronary bypass graft: Secondary | ICD-10-CM

## 2012-06-08 MED ORDER — TECHNETIUM TC 99M SESTAMIBI GENERIC - CARDIOLITE
10.6000 | Freq: Once | INTRAVENOUS | Status: AC | PRN
  Administered 2012-06-08: 11 via INTRAVENOUS

## 2012-06-08 MED ORDER — TECHNETIUM TC 99M SESTAMIBI GENERIC - CARDIOLITE
30.2000 | Freq: Once | INTRAVENOUS | Status: AC | PRN
  Administered 2012-06-08: 30.2 via INTRAVENOUS

## 2012-06-08 NOTE — Progress Notes (Signed)
[facility]   2D echo completed 06/08/2012.   Cindy Abygayle Deltoro, RDCS  

## 2012-06-08 NOTE — Procedures (Addendum)
[facility] [address] [place] [address] [place] [address] [phone]  Cardiology Nuclear Med Study  Russell Patton is a 72 y.o. male     MRN : [id number]     DOB: 13-Feb-1941  Procedure Date: 06/08/2012  Nuclear Med Background Indication for Stress Test:  Graft Patency, PTCA Patency and Abnormal EKG History:  CAD;CABG X4-01/29/2011;PTCA Cardiac Risk Factors: Carotid Disease, Family History - CAD, History of Smoking, Hypertension, Lipids, Obesity, Overweight and PVD  Symptoms:  Chest Pain, Dizziness, DOE, Light-Headedness and SOB   Nuclear Pre-Procedure Caffeine/Decaff Intake:  7:00pm NPO After: 5:00am   IV Site: R Antecubital  IV 0.9% NS with Angio Cath:  22g  Chest Size (in):  46"  IV Started by: Emmit Pomfret, RN  Height: 5\' 9"  (1.753 m)  Cup Size: n/a  BMI:  Body mass index is 29.67 kg/(m^2). Weight:  201 lb (91.173 kg)   Tech Comments:  N/A    Nuclear Med Study 1 or 2 day study: 1 day  Stress Test Type:  Stress  Order Authorizing Provider:  Thurmon Fair, MD    Resting Radionuclide: Technetium 56m Sestamibi  Resting Radionuclide Dose: 10.6 mCi   Stress Radionuclide:  Technetium 65m Sestamibi  Stress Radionuclide Dose: 30.2 mCi           Stress Protocol Rest HR: 71 Stress HR: 125  Rest BP: 127/73 Stress BP: 143/73  Exercise Time (min): 6:01 METS: 6.90          Dose of Adenosine (mg):  n/a Dose of Lexiscan: n/a mg  Dose of Atropine (mg): n/a Dose of Dobutamine: n/a mcg/kg/min (at max HR)  Stress Test Technologist: Ernestene Mention, CCT Nuclear Technologist: Gonzella Lex, CNMT   Rest Procedure:  Myocardial perfusion imaging was performed at rest 45 minutes following the intravenous administration of Technetium 65m Sestamibi. Stress Procedure:  The patient performed treadmill exercise using a Bruce  Protocol for 6 minutes. The patient stopped due to extreme shortness of breath. Patient denied any chest pain only stated  that he could not continue walking on the treadmill because it was getting very hard for him to breathe.  There were significant ST-T wave changes.  Technetium 8m Sestamibi was injected at peak exercise and myocardial perfusion imaging was performed after a brief delay.  Transient Ischemic Dilatation (Normal <1.22):  0.99 Lung/Heart Ratio (Normal <0.45):  0.38 QGS EDV:  72 ml QGS ESV:  21 ml LV Ejection Fraction: 70%  Rest ECG: NSR with incomplete RBBB, non-specific ST changes  Stress ECG: Significant ST abnormalities consistent with ischemia. Thre is 4-5 mm horizontal ST segment depression inferiorly and laterally, with elevation in AVR at peak stress.  QPS Raw Data Images:  Normal; no motion artifact; normal heart/lung ratio. Stress Images:  There is decreased uptake in the inferior wall. this involves the basal to apical inferior wall. Rest Images:  There is decreased uptake in the inferior wall. only involving the basal inferior wall. Subtraction (SDS):  These findings are consistent with ischemia.  Impression Exercise Capacity:  Poor exercise capacity. BP Response:  Hypotensive blood pressure response. Clinical Symptoms:  Marked dyspnea. ECG Impression:  4-5 mm Horizontal ST depression inferiorly and laterally, highly suggestive of ischemia. Comparison with Prior Nuclear Study: No images to compare  Overall Impression:  High risk stress nuclear study.  Significant reversible defect of the inferior wall. ST segment changes consistent with ischemia.  Recommend cardiac catheterization.  LV Wall Motion:  NL LV  Function; NL Wall Motion; EF 70%.  Chrystie Nose, MD, [facility] Board Certified in Nuclear Cardiology Attending Cardiologist [facility]   Chrystie Nose, MD  06/08/2012 1:23 PM

## 2012-06-14 ENCOUNTER — Ambulatory Visit (HOSPITAL_COMMUNITY)
Admission: RE | Admit: 2012-06-14 | Discharge: 2012-06-15 | Disposition: A | Discharge status: Home / Self Care | Payer: Medicare Other | Source: Ambulatory Visit | Attending: Internal Medicine | Admitting: Internal Medicine

## 2012-06-14 ENCOUNTER — Encounter (HOSPITAL_COMMUNITY): Payer: Self-pay

## 2012-06-14 ENCOUNTER — Encounter (HOSPITAL_COMMUNITY)
Admission: RE | Disposition: A | Discharge status: Home / Self Care | Payer: Self-pay | Source: Ambulatory Visit | Attending: Internal Medicine

## 2012-06-14 DIAGNOSIS — Z951 Presence of aortocoronary bypass graft: Secondary | ICD-10-CM

## 2012-06-14 DIAGNOSIS — R0602 Shortness of breath: Secondary | ICD-10-CM | POA: Insufficient documentation

## 2012-06-14 DIAGNOSIS — I2510 Atherosclerotic heart disease of native coronary artery without angina pectoris: Secondary | ICD-10-CM | POA: Insufficient documentation

## 2012-06-14 DIAGNOSIS — R9439 Abnormal result of other cardiovascular function study: Secondary | ICD-10-CM | POA: Insufficient documentation

## 2012-06-14 DIAGNOSIS — Z955 Presence of coronary angioplasty implant and graft: Secondary | ICD-10-CM

## 2012-06-14 DIAGNOSIS — I25810 Atherosclerosis of coronary artery bypass graft(s) without angina pectoris: Secondary | ICD-10-CM | POA: Insufficient documentation

## 2012-06-14 DIAGNOSIS — I10 Essential (primary) hypertension: Secondary | ICD-10-CM | POA: Insufficient documentation

## 2012-06-14 DIAGNOSIS — E785 Hyperlipidemia, unspecified: Secondary | ICD-10-CM | POA: Insufficient documentation

## 2012-06-14 DIAGNOSIS — R079 Chest pain, unspecified: Secondary | ICD-10-CM | POA: Insufficient documentation

## 2012-06-14 HISTORY — DX: Shortness of breath: R06.02

## 2012-06-14 HISTORY — DX: Chronic kidney disease, unspecified: N18.9

## 2012-06-14 HISTORY — DX: Angina pectoris, unspecified: I20.9

## 2012-06-14 HISTORY — PX: LEFT HEART CATHETERIZATION WITH CORONARY ANGIOGRAM: SHX5451

## 2012-06-14 SURGERY — LEFT HEART CATHETERIZATION WITH CORONARY ANGIOGRAM
Anesthesia: LOCAL

## 2012-06-14 MED ORDER — SODIUM CHLORIDE 0.9 % IV SOLN
0.2000 mg/kg/h | INTRAVENOUS | Status: AC
  Filled 2012-06-14: qty 250

## 2012-06-14 MED ORDER — BIVALIRUDIN 250 MG IV SOLR
INTRAVENOUS | Status: AC
  Filled 2012-06-14: qty 250

## 2012-06-14 MED ORDER — LIDOCAINE HCL (PF) 1 % IJ SOLN
INTRAMUSCULAR | Status: AC
  Filled 2012-06-14: qty 30

## 2012-06-14 MED ORDER — ADULT MULTIVITAMIN W/MINERALS CH
1.0000 | ORAL_TABLET | Freq: Every day | ORAL | Status: DC
  Administered 2012-06-14 – 2012-06-15 (×2): 1 via ORAL
  Filled 2012-06-14 (×2): qty 1

## 2012-06-14 MED ORDER — NITROGLYCERIN 1 MG/10 ML FOR IR/CATH LAB
INTRA_ARTERIAL | Status: AC
  Filled 2012-06-14: qty 10

## 2012-06-14 MED ORDER — ASPIRIN EC 81 MG PO TBEC
81.0000 mg | DELAYED_RELEASE_TABLET | Freq: Every day | ORAL | Status: DC
  Administered 2012-06-15: 81 mg via ORAL
  Filled 2012-06-14: qty 1

## 2012-06-14 MED ORDER — MIDAZOLAM HCL 2 MG/2ML IJ SOLN
INTRAMUSCULAR | Status: AC
  Filled 2012-06-14: qty 2

## 2012-06-14 MED ORDER — TICAGRELOR 90 MG PO TABS
90.0000 mg | ORAL_TABLET | Freq: Two times a day (BID) | ORAL | Status: DC
  Administered 2012-06-14: 90 mg via ORAL
  Filled 2012-06-14 (×2): qty 1

## 2012-06-14 MED ORDER — TICAGRELOR 90 MG PO TABS
ORAL_TABLET | ORAL | Status: AC
  Administered 2012-06-15: 11:00:00 90 mg via ORAL
  Filled 2012-06-14: qty 2

## 2012-06-14 MED ORDER — SODIUM CHLORIDE 0.9 % IJ SOLN: 3.0000 mL | INTRAMUSCULAR | Status: DC | PRN

## 2012-06-14 MED ORDER — ASPIRIN EC 81 MG PO TBEC
81.0000 mg | DELAYED_RELEASE_TABLET | Freq: Every day | ORAL | Status: DC
  Filled 2012-06-14: qty 1

## 2012-06-14 MED ORDER — ONDANSETRON HCL 4 MG/2ML IJ SOLN: 4.0000 mg | Freq: Four times a day (QID) | INTRAMUSCULAR | Status: DC | PRN

## 2012-06-14 MED ORDER — FENTANYL CITRATE 0.05 MG/ML IJ SOLN
INTRAMUSCULAR | Status: AC
  Filled 2012-06-14: qty 2

## 2012-06-14 MED ORDER — ACETAMINOPHEN 325 MG PO TABS: 650.0000 mg | ORAL_TABLET | ORAL | Status: DC | PRN

## 2012-06-14 MED ORDER — SODIUM CHLORIDE 0.9 % IV SOLN: 250.0000 mL | INTRAVENOUS | Status: DC | PRN

## 2012-06-14 MED ORDER — LISINOPRIL 10 MG PO TABS
10.0000 mg | ORAL_TABLET | Freq: Every day | ORAL | Status: DC
  Administered 2012-06-15: 10 mg via ORAL
  Filled 2012-06-14 (×2): qty 1

## 2012-06-14 MED ORDER — ASPIRIN 81 MG PO CHEW
324.0000 mg | CHEWABLE_TABLET | ORAL | Status: AC
  Administered 2012-06-14: 324 mg via ORAL
  Filled 2012-06-14: qty 4

## 2012-06-14 MED ORDER — METOPROLOL TARTRATE 25 MG PO TABS
25.0000 mg | ORAL_TABLET | Freq: Every evening | ORAL | Status: DC
  Administered 2012-06-14: 25 mg via ORAL
  Filled 2012-06-14 (×2): qty 1

## 2012-06-14 MED ORDER — SODIUM CHLORIDE 0.9 % IV SOLN
INTRAVENOUS | Status: DC
  Administered 2012-06-14: 1000 mL via INTRAVENOUS
  Administered 2012-06-14: 14:00:00 via INTRAVENOUS

## 2012-06-14 MED ORDER — HEPARIN (PORCINE) IN NACL 2-0.9 UNIT/ML-% IJ SOLN
INTRAMUSCULAR | Status: AC
  Filled 2012-06-14: qty 1000

## 2012-06-14 MED ORDER — SODIUM CHLORIDE 0.9 % IV SOLN
INTRAVENOUS | Status: DC
  Administered 2012-06-14: 07:00:00 via INTRAVENOUS

## 2012-06-14 MED ORDER — EZETIMIBE 10 MG PO TABS
10.0000 mg | ORAL_TABLET | Freq: Every evening | ORAL | Status: DC
  Administered 2012-06-14: 16:00:00 10 mg via ORAL
  Filled 2012-06-14 (×2): qty 1

## 2012-06-14 MED ORDER — SODIUM CHLORIDE 0.9 % IJ SOLN: 3.0000 mL | Freq: Two times a day (BID) | INTRAMUSCULAR | Status: DC

## 2012-06-14 NOTE — Progress Notes (Signed)
Utilization Review Completed Chaitanya Amedee J. Gaudencio Chesnut, RN, BSN, NCM [phone]  

## 2012-06-14 NOTE — H&P (Signed)
     [facility]          INTERVAL PROCEDURE H&P   History and Physical Interval Note:  06/14/2012 8:39 AM  Russell Patton has presented today for their planned procedure. The various methods of treatment have been discussed with the patient and family. After consideration of risks, benefits and other options for treatment, the patient has consented to the procedure.  The patients' outpatient history has been reviewed, patient examined, and no change in status from most recent office note within the past 30 days. I have reviewed the patients' chart and labs and will proceed as planned. Questions were answered to the patient's satisfaction.   Chrystie Nose, MD, [facility] Attending Cardiologist [facility]  Karine Garn C 06/14/2012, 8:39 AM

## 2012-06-14 NOTE — Progress Notes (Signed)
Site area: right groin  Site Prior to Removal:  Level 0  Pressure Applied For 20 MINUTES    Minutes Beginning at 1830  Manual:   yes  Patient Status During Pull:  stable  Post Pull Groin Site:  Level 0  Post Pull Instructions Given:  yes  Post Pull Pulses Present:  yes  Dressing Applied:  yes  Comments:

## 2012-06-14 NOTE — Progress Notes (Signed)
ANTICOAGULATION CONSULT NOTE - Initial Consult  Pharmacy Consult for bivalrudin Indication: s/p cath  Allergies  Allergen Reactions  . Statins Other (See Comments)    Muscle aches     Patient Measurements: Height: 5\' 9"  (175.3 cm) Weight: 201 lb (91.173 kg) IBW/kg (Calculated) : 70.7 Heparin Dosing Weight:   Vital Signs: Temp: 97.4 F (36.3 C) (03/27 1610) Temp src: Oral (03/27 0639) BP: 126/71 mmHg (03/27 0639) Pulse Rate: 55 (03/27 0846)  Labs: No results found for this basename: HGB, HCT, PLT, APTT, LABPROT, INR, HEPARINUNFRC, CREATININE, CKTOTAL, CKMB, TROPONINI,  in the last 72 hours  Estimated Creatinine Clearance: 79.3 ml/min (by C-G formula based on Cr of 0.94).   Medical History: Past Medical History  Diagnosis Date  . Hypertension   . Carotid artery stenosis   . Hyperlipidemia   . Coronary artery disease     CABG x4 2012  . 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 Arthritis   . Smoker     quit  . History of adenomatous polyp of colon 2005/2008  . Subclavian artery stenosis, right     Medications:  Scheduled:  . [COMPLETED] aspirin  324 mg Oral Pre-Cath  . [START ON 06/15/2012] aspirin EC  81 mg Oral Daily  . [COMPLETED] bivalirudin      . [COMPLETED] bivalirudin      . ezetimibe  10 mg Oral QPM  . [COMPLETED] fentaNYL      . [COMPLETED] heparin      . [COMPLETED] lidocaine (PF)      . lisinopril  10 mg Oral Daily  . metoprolol tartrate  25 mg Oral QPM  . [COMPLETED] midazolam      . [COMPLETED] midazolam      . multivitamin with minerals  1 tablet Oral Daily  . [COMPLETED] nitroGLYCERIN      . [COMPLETED] Ticagrelor      . Ticagrelor  90 mg Oral BID  . [DISCONTINUED] aspirin EC  81 mg Oral Daily  . [DISCONTINUED] sodium chloride  3 mL Intravenous Q12H   Infusions:  . sodium chloride 1,000 mL (06/14/12 1238)  . bivalirudin (ANGIOMAX) infusion 5 mg/mL (Cath Lab,ACS,PCI indication)    . [DISCONTINUED] sodium chloride 75 mL/hr at 06/14/12 9604     Assessment: 72 yo male s/p cath will be continued on bivalrudin x 2 hours per pharmacy.  Baseline CBC ok. Goal of Therapy:   Monitor platelets by anticoagulation protocol: Yes   Plan:  1) Continue bivalrudin at 0.2 mg/kg/hr x 2 hours, then off.  Will sign off  Irvin Lizama, Tsz-Yin 06/14/2012,1:30 PM

## 2012-06-14 NOTE — CV Procedure (Signed)
[facility]    CARDIAC CATHETERIZATION REPORT  Russell Patton   [id number] 01-24-1941  Performing Cardiologist: Chrystie Nose Primary Physician: Cassell Smiles., MD Primary Cardiologist:  Dr. Rennis Golden  Procedures Performed:  Left Heart Catheterization via 5 Fr right femoral artery access  Native Coronary Angiography  Internal Mammary Graft Angiography  Saphenous Vein Graft Angiography  Indication(s): dyspnea, chest pain, dizziness  Pre-Procedural Non-invasive testing: Consistent with viable (ischemic) myocardium. Large reversible inferior defect.  History: 72 y.o. male presented with increasing shortness of breath, chest heaviness and increasing exercise intolerance. He previously had 4 vessel CABG in 2012 for severe distal LM bifurcation disease and high grade proximal RCA disease. He had LIMA to LAD, SVG to PDA, SVG to OM and SVG To Diag. He underwent nuclear stress testing in our office which revealed essentially reversible inferior ischemia. He had significant ST segment depression of 4-5 mm with exertion.  I spoke with him after the test and recommended heart catheterization.  Consent: The procedure with Risks/Benefits/Alternatives and Indications were reviewed with the patient (and family).  All questions were answered.    Risks / Complications include, but not limited to: Death, MI, CVA/TIA, VF/VT (with defibrillation), Bradycardia (need for temporary pacer placement), contrast induced nephropathy, bleeding / bruising / hematoma / pseudoaneurysm, vascular or coronary injury (with possible emergent CT or Vascular Surgery), adverse medication reactions, infection.    Consent: Risks of procedure as well as the alternatives and risks of each were explained to the (patient/caregiver).  Consent for procedure obtained.  Procedure: The patient was brought to the 2nd Floor [facility] in the fasting state and prepped and draped in  the usual sterile fashion for (Right groin) access.  Time Out: Verified patient identification, verified procedure, site/side was marked, verified correct patient position, special equipment/implants available, radiation safety measures in place (including badges and shielding), medications/allergies/relevent history reviewed, required imaging and test results available.  Performed  Procedure: The right femoral head was identified using tactile and fluoroscopic technique.  The right groin was anesthetized with 1% subcutaneous Lidocaine.  The right Common Femoral Artery was accessed using the Modified Seldinger Technique with placement of (5 Fr) sheath using the Seldinger technique.  The sheath was aspirated and flushed.  A 5 Fr JL4 Catheter was advanced of over a Standard J wire into the ascending Aorta.  The catheter was used to engage the left coronary artery.  Multiple cineangiographic views of the left coronary artery system(s) were performed. A 5 Fr JR4 Catheter was advanced of over a Safety J wire into the ascending Aorta.  The catheter was used to engage the right coronary artery.  Multiple cineangiographic views of the right coronary artery system(s) were performed. A 69F LCB catheter was then used to access the bypass grafts and a 69F IM catheter was used to selectively engage the LIMA (which was noted to be somewhat more lateral in the subclavian/axillary artery transition territory. This catheter was then exchanged over the Standard J wire for an angled Pigtail catheter that was advanced across the Aortic Valve.  LV hemodynamics were measured and the catheter was pulled back across the Aortic Valve for measurement of "pull-back" gradient.  The catheter and the wire was removed completely out of the body.   At this point, it was determined the patient should undergo coronary intervention. I discussed with Dr. Tresa Endo and he will proceed. Please see his intervention note.  Recovery: The patient was  transported to the  cath lab holding area in stable condition.   The patient  was stable before, during and following the procedure.   Patient did tolerate procedure well. There were not complications.  EBL: Minimal  Medications:  Premedication: None  Sedation:  2 mg IV Versed, 50 mcg IV Fentanyl  Contrast:  75 ml Omnipaque   Hemodynamics:  Central Aortic Pressure / Mean Aortic Pressure: 95/50  LV Pressure / LV End diastolic Pressure:  6  Coronary Angiographic Data:  Left Main:  Distal tapering 95% stenosis at the bifurcation  Left Anterior Descending (LAD):  Moderate-sized vessel, there is competitive flow noted in the mid-distal LAD.  1st diagonal (D1):  There is distal competitive flow, however, this may be reverse filling from the LIMA.  Circumflex (LCx):  Ostial stenosis, but may be filling from the LAD system.  1st obtuse marginal:  Moderate-sized vessel, no significant stenosis.  2nd obtuse marginal:  Smaller vessel, collaterals noted from the right-system.    Right Coronary Artery: Dominant. There is a 95% very proximal stenosis with a short ectactic segment just distal to the stenosis. There is a tubular area of moderate stenosis in the mid-RCA of 50-60% stenosis.  This does not appear significantly changed from his prior catheterization 2 years ago.  right ventricle branch of right coronary artery: Ostial stenosis, but this is a small vessel.  posterior descending artery: moderate ostial stenosis.-  posterior lateral branch:  No significant disease.  Grafts  LIMA - LAD: The ostium is about 3-4 cm from the right angle of the proximal subclavian.  This is a patent vessel with TIMI III flow.  SVG - DIAG: Appears ostially occluded with a small nub.  SVG - OM: Appears ostially occluded with a small nub.  SVG - RCA (RPDA/RPL): Appears ostially occluded with a small nub.  Impression: 1.  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  Moderate mid-RCA stenosis. 2.  Occluded SVG to D1, OM2 and PDA. 3.  LVEDP = 6 mmHg  Plan: 1.  The nuclear stress test indicated inferior, but not lateral ischemia. We will plan PCI to the proximal RCA.  The mid-RCA appears to be a tubular area of 50-60% stenosis which has been stable since 2012. 2.  Unclear if LCx circulation is compromised, but there appears to be collaterals from the RCA and probably backflow from the LIMA/LAD system.  Consider repeat nuclear stress test ~1 month after PCI to determine if there are additional reversible perfusion defects. 3.  He has been intolerant to statins. Will need to try alternative medications to help lower cholesterol. 4.  Admit overnight for observation.  The case and results was discussed with the patient and family if available.  The case and results was not discussed with the patient's PCP. The case and results was discussed with the patient's Cardiologist.  Time Spent Directly with the Patient:  45 minutes  Chrystie Nose, MD, [facility] Attending Cardiologist [facility]  Rickard Kennerly C 06/14/2012, 11:06 AM

## 2012-06-14 NOTE — CV Procedure (Signed)
Percutaneous Coronary Intervention: Proximal native RCA   Russell Patton, 72 y.o., male  Full note dictated; see diagram  DICTATION # [id number], [id number]  A0: 100/50  Native RCA: via 6 F sheath; 6R FR4 with SH, Prowater wire, 2.5 x 12 Emerge balloon, 3.0 x 15 Xience Xpedition stent, 3.5 x 12 [place] Sprinter, and 3.75 x 8 [place] Trek post-dilated to ~3.75 - 3.6 mm 95% to 0  Angiomax, 180 mg brilinta, IC Ntg.  Tolerated well.   Lennette Bihari, MD, [facility] 06/14/2012 12:34 PM

## 2012-06-15 DIAGNOSIS — R9439 Abnormal result of other cardiovascular function study: Secondary | ICD-10-CM

## 2012-06-15 LAB — BASIC METABOLIC PANEL
BUN: 11 mg/dL (ref 6–23)
CO2: 23 mEq/L (ref 19–32)
Chloride: 107 mEq/L (ref 96–112)
GFR calc non Af Amer: 81 mL/min — ABNORMAL LOW (ref >90)
Glucose, Bld: 103 mg/dL — ABNORMAL HIGH (ref 70–99)
Potassium: 4.1 mEq/L (ref 3.5–5.1)
Sodium: 139 mEq/L (ref 135–145)

## 2012-06-15 LAB — CBC
HCT: 42.8 % (ref 39.0–52.0)
Hemoglobin: 14.8 g/dL (ref 13.0–17.0)
MCHC: 34.6 g/dL (ref 30.0–36.0)
RBC: 4.92 MIL/uL (ref 4.22–5.81)
WBC: 7.3 10*3/uL (ref 4.0–10.5)

## 2012-06-15 MED ORDER — TICAGRELOR 90 MG PO TABS: 90.0000 mg | ORAL_TABLET | Freq: Two times a day (BID) | ORAL | Status: DC

## 2012-06-15 MED FILL — Dextrose Inj 5%: INTRAVENOUS | Qty: 50 | Status: AC

## 2012-06-15 NOTE — Progress Notes (Signed)
CARDIAC REHAB PHASE I   PRE:  Rate/Rhythm: 92 SR    BP: sitting 134/80    SaO2:   MODE:  Ambulation: 1000 ft   POST:  Rate/Rhythm: 106 ST    BP: sitting 165/91     SaO2:   Tolerated well. Less SOB than on admit per pt with increased pace. BP up after walk.  Ed completed. Not very interested in CRPII but sts we can send referral to  to check into it. Will send.  [phone]  Elissa Lovett Buchtel CES, ACSM 06/15/2012 8:32 AM

## 2012-06-15 NOTE — Progress Notes (Signed)
[facility]  Subjective: No chest pain, groin pain, bleeding or SOB.  Objective: Vital signs in last 24 hours: Temp:  [97.5 F (36.4 C)-98.3 F (36.8 C)] 97.9 F (36.6 C) (03/28 0742) Pulse Rate:  [52-97] 91 (03/28 0742) BP: (88-146)/(47-81) 134/80 mmHg (03/28 0742) SpO2:  [92 %-100 %] 93 % (03/28 0000) Weight:  [198 lb 10.2 oz (90.1 kg)] 198 lb 10.2 oz (90.1 kg) (03/28 0511) Last BM Date: 06/13/12  Intake/Output from previous day: 03/27 0701 - 03/28 0700 In: 2365 [P.O.:360; I.V.:2005] Out: 1100 [Urine:1100] Intake/Output this shift: Total I/O In: 360 [P.O.:360] Out: -   Medications Current Facility-Administered Medications  Medication Dose Route Frequency Provider Last Rate Last Dose  . 0.9 %  sodium chloride infusion   Intravenous Continuous Lennette Bihari, MD 150 mL/hr at 06/14/12 1400    . acetaminophen (TYLENOL) tablet 650 mg  650 mg Oral Q4H PRN Lennette Bihari, MD      . aspirin EC tablet 81 mg  81 mg Oral Daily Lennette Bihari, MD      . ezetimibe (ZETIA) tablet 10 mg  10 mg Oral QPM Chrystie Nose, MD   10 mg at 06/14/12 1607  . lisinopril (PRINIVIL,ZESTRIL) tablet 10 mg  10 mg Oral Daily Chrystie Nose, MD      . metoprolol tartrate (LOPRESSOR) tablet 25 mg  25 mg Oral QPM Chrystie Nose, MD   25 mg at 06/14/12 1607  . multivitamin with minerals tablet 1 tablet  1 tablet Oral Daily Chrystie Nose, MD   1 tablet at 06/14/12 1452  . ondansetron (ZOFRAN) injection 4 mg  4 mg Intravenous Q6H PRN Lennette Bihari, MD      . Ticagrelor [facility]) tablet 90 mg  90 mg Oral BID Lennette Bihari, MD   90 mg at 06/14/12 2136    PE: General appearance: alert, cooperative and no distress Lungs: clear to auscultation bilaterally Heart: regular rate and rhythm Extremities: the right groin is stable: no hematoma, bleeding, ecchymosis or bruit, no LEE Pulses: 2+ and symmetric Skin: warm and dry Neurologic: Grossly normal  Lab Results:    Recent Labs  06/15/12 0430  WBC 7.3  HGB 14.8  HCT 42.8  PLT 188   BMET  Recent Labs  06/15/12 0430  NA 139  K 4.1  CL 107  CO2 23  GLUCOSE 103*  BUN 11  CREATININE 0.95  CALCIUM 8.7    Studies/Results:  LHC 06/14/12 Hemodynamics:  Central Aortic Pressure / Mean Aortic Pressure: 95/50  LV Pressure / LV End diastolic Pressure: 6  Coronary Angiographic Data:  Left Main: Distal tapering 95% stenosis at the bifurcation  Left Anterior Descending (LAD): Moderate-sized vessel, there is competitive flow noted in the mid-distal LAD.  1st diagonal (D1): There is distal competitive flow, however, this may be reverse filling from the LIMA.  Circumflex (LCx): Ostial stenosis, but may be filling from the LAD system.  1st obtuse marginal: Moderate-sized vessel, no significant stenosis.  2nd obtuse marginal: Smaller vessel, collaterals noted from the right-system.  Right Coronary Artery: Dominant. There is a 95% very proximal stenosis with a short ectactic segment just distal to the stenosis. There is a tubular area of moderate stenosis in the mid-RCA of 50-60% stenosis. This does not appear significantly changed from his prior catheterization 2 years ago.  right ventricle branch of right coronary artery: Ostial stenosis, but this is a small vessel.  posterior descending  artery: moderate ostial stenosis.-  posterior lateral branch: No significant disease. Grafts  LIMA - LAD: The ostium is about 3-4 cm from the right angle of the proximal subclavian. This is a patent vessel with TIMI III flow.  SVG - DIAG: Appears ostially occluded with a small nub.  SVG - OM: Appears ostially occluded with a small nub.  SVG - RCA (RPDA/RPL): Appears ostially occluded with a small nub.   Assessment/Plan  Principal Problem:   Abnormal nuclear stress test Active Problems:   HTN (hypertension)   Dyslipidemia, goal LDL below 100   CAD - S/P CAGB x 4 (2012), S/P PCI + Stenting to native RCA w/  DES   S/P CABG x 4: 01/29/11: LIMA to LAD, saphenous vein graft to first diagonal, saphenous vein graft to circumflex marginal, saphenous vein graft to posterior descending   Plan: S/P LHC with PCI. POD#1. The cath revealed multivessel CAD with severe distal LM stenosis and high grade proximal RCA stenosis. Moderate mid-RCA stenosis. Occluded SVG to D1, OM2 and PDA. LVEDP = 6 mmHg. He underwent PCI to the proximal native RCA with a DES. On DAPT with ASA + Brilinta. HR and BP both stable. Labs are WNL. The right groin is stable. CP free. Plan for discharge today. Will arrange f/u with Dr. Rennis Golden in [place].    LOS: 1 day    Russell Patton 06/15/2012 9:22 AM  I have seen and evaluated the patient this PM along with Boyce Medici, PA. I agree with her findings, examination as well as impression recommendations.  Overnight Observation post PCI to native RCA.  Groin stable, no further CP.  D/c today on ASA & Ticagrelor. ROV with Dr. Rennis Golden in [place]. On BB, ACE-I & Zetia (no statin due to intolerance).    Marykay Lex, M.D., M.S. [facility] [address] [place], [place]  [address]  [phone] Pager # [phone] 06/15/2012 12:52 PM

## 2012-06-15 NOTE — Discharge Summary (Signed)
Physician Discharge Summary  Patient ID: Russell Patton MRN: [id number] DOB/AGE: [birth year] 72 y.o.  Admit date: 06/14/2012 Discharge date: 06/15/2012  Admission Diagnoses: Abnormal Nuclear Stress Test  Discharge Diagnoses:  Principal Problem:   Abnormal nuclear stress test Active Problems:   HTN (hypertension)   Dyslipidemia, goal LDL below 100   CAD - S/P CAGB x 4 (2012), S/P PCI + Stenting to native RCA w/ DES 06/14/12   S/P CABG x 4: This admit 01/29/11: LIMA to LAD, saphenous vein graft to first diagonal, saphenous vein graft to circumflex marginal, saphenous vein graft to posterior descending   Discharged Condition: stable  Hospital Course: The patient is a 72 y.o. male, followed at [facility] by Dr. Rennis Golden, who presented to [facility] on 3/27 to undergo a planned diagnostic left heart catherterization. He previously had 4 vessel CABG in 2012 for severe LM bifurcation disease and high grade proximal RCA disease. He had a LIMA to LAD, SVG to PDA, SVG to OM and SVG to Diag. He underwent a nuclear stress test at [facility] which revealed essentially reversible inferior ischemia. He had significant ST segment depression of 4-5 mm with exertion. Dr. Rennis Golden had discussed with him the test results and had recommended a heart catheterization. On 3/27, Dr. Rennis Golden performed a diagnostic LHC, that revealed multivessel CAD with severe distal LM stenosis and high grade proximal RCA stenosis. He had moderate mid-RCA stenosis. There was an occluded SVG to D1, OM2 and PDA. LVEDP was 6 mmHg.  He then underwent successful PCI and stenting to the proximal native RCA with a DES. This was performed by Dr. Tresa Endo. He left the cath lab in stable condition. He was placed on dual antiplatelet therapy with ASA and Brilinta. He was kept overnight for observation and hydration. He had no post-operative complications. On post-op day 1, he was chest pain free and the right groin was stable. He was last seen and examined by Dr. Herbie Baltimore, who  felt he was stable for discharge home. He will follow up with Dr. Rennis Golden in [place].   Consults: None  Significant Diagnostic Studies:  LHC 3/27 Hemodynamics:  Central Aortic Pressure / Mean Aortic Pressure: 95/50  LV Pressure / LV End diastolic Pressure: 6  Coronary Angiographic Data:  Left Main: Distal tapering 95% stenosis at the bifurcation  Left Anterior Descending (LAD): Moderate-sized vessel, there is competitive flow noted in the mid-distal LAD.  1st diagonal (D1): There is distal competitive flow, however, this may be reverse filling from the LIMA.  Circumflex (LCx): Ostial stenosis, but may be filling from the LAD system.  1st obtuse marginal: Moderate-sized vessel, no significant stenosis.  2nd obtuse marginal: Smaller vessel, collaterals noted from the right-system.  Right Coronary Artery: Dominant. There is a 95% very proximal stenosis with a short ectactic segment just distal to the stenosis. There is a tubular area of moderate stenosis in the mid-RCA of 50-60% stenosis. This does not appear significantly changed from his prior catheterization 2 years ago.  right ventricle branch of right coronary artery: Ostial stenosis, but this is a small vessel.  posterior descending artery: moderate ostial stenosis.-  posterior lateral branch: No significant disease. Grafts  LIMA - LAD: The ostium is about 3-4 cm from the right angle of the proximal subclavian. This is a patent vessel with TIMI III flow.  SVG - DIAG: Appears ostially occluded with a small nub.  SVG - OM: Appears ostially occluded with a small nub.  SVG - RCA (RPDA/RPL): Appears ostially occluded with  a small nub.  Treatments: See Hospital Course  Discharge Exam: Blood pressure 134/80, pulse 91, temperature 97.9 F (36.6 C), temperature source Oral, resp. rate 20, height 5\' 9"  (1.753 m), weight 198 lb 10.2 oz (90.1 kg), SpO2 93.00%.   Disposition: 01-Home or Self Care      Discharge Orders   Future Orders  Complete By Expires     Amb Referral to Cardiac Rehabilitation  As directed     Diet - low sodium heart healthy  As directed     Discharge instructions  As directed     Comments:      No driving for 3 days No lifting more than 1/2 gallon of milk for 3 days        Medication List    TAKE these medications       aspirin EC 81 MG tablet  Take 81 mg by mouth daily.     ezetimibe 10 MG tablet  Commonly known as:  ZETIA  Take 10 mg by mouth every evening.     lisinopril 10 MG tablet  Commonly known as:  PRINIVIL,ZESTRIL  Take 10 mg by mouth daily.     metoprolol tartrate 25 MG tablet  Commonly known as:  LOPRESSOR  Take 25 mg by mouth every evening.     multivitamin with minerals Tabs  Take 1 tablet by mouth daily.     nitroGLYCERIN 0.4 MG SL tablet  Commonly known as:  NITROSTAT  Place 0.4 mg under the tongue every 5 (five) minutes as needed for chest pain.     Ticagrelor 90 MG Tabs tablet  Commonly known as:  BRILINTA  Take 1 tablet (90 mg total) by mouth 2 (two) times daily.     Ticagrelor 90 MG Tabs tablet  Commonly known as:  BRILINTA  Take 1 tablet (90 mg total) by mouth 2 (two) times daily.       Follow-up Information   Follow up with Chrystie Nose, MD. (Our office will call you to arrange follow-up with Dr. Rennis Golden in [place] )    Contact information:   [address] [place] [address] [phone]      TIME SPENT ON DISCHARGE, INCLUDING PHYSICIAN TIME: > 30 MINUTES  Signed: Allayne Butcher, PA-C 06/18/2012, 12:08 PM  I saw & examined the patient on the day of d/c s/p PCI.  Groin was stable.  No complaints. Ready  For d/c & ROV with Dr. Rennis Golden.  Marykay Lex, M.D., M.S. [facility] [address] [place], [place]  [address]  [phone] Pager # [phone] 06/18/2012 5:31 PM

## 2012-06-15 NOTE — Care Management Note (Signed)
    Page 1 of 1   06/15/2012     1:55:31 PM   CARE MANAGEMENT NOTE 06/15/2012  Patient:  Russell Patton, Russell Patton   Account Number:  [id number]  Date Initiated:  06/15/2012  Documentation initiated by:  [facility]  Subjective/Objective Assessment:   72 y.o. male with a history of CAD (MI s/p prior balloon angioplasty of the distal circumflex), HTN, HLD, and former tobacco use who presented to the ED with chest pain.     Action/Plan:   home with spouse/ CM to do benefits check on Brilinta. Will make pt aware once complete.   Anticipated DC Date:  06/15/2012   Anticipated DC Plan:  HOME/SELF CARE      DC Planning Services  CM consult      Choice offered to / List presented to:             Status of service:   Medicare Important Message given?   (If response is "NO", the following Medicare IM given date fields will be blank) Date Medicare IM given:   Date Additional Medicare IM given:    Discharge Disposition:    Per UR Regulation:    If discussed at Long Length of Stay Meetings, dates discussed:    Comments:  06/15/12 @ 1030.Marland KitchenMarland KitchenMarland KitchenOletta Cohn, RN, BSN, [employer]  [phone] Spoke to pt regarding Brilinta medication assistance.  Pt states he uses [facility].  CM called pharmacy to verify supply.  Pt notified.

## 2012-06-15 NOTE — Cardiovascular Report (Signed)
NAMEYAO, HYPPOLITE NO.:  [id number]  MEDICAL RECORD NO.:  [id number]  LOCATION:  6529                         FACILITY:  [facility]  PHYSICIAN:  Nicki Guadalajara, M.D.     DATE OF BIRTH:  [birth year]  DATE OF PROCEDURE:  06/14/2012 DATE OF DISCHARGE:                           CARDIAC CATHETERIZATION   PROCEDURE:  Percutaneous coronary intervention with PTCA/stenting of the proximal native right coronary artery.  INDICATIONS:  Mr. Christapher Gillian is a 72 year old gentleman of Dr. Rennis Golden who in November 2012 was found to have severe multivessel CAD and underwent 4-vessel bypass surgery with a LIMA to the LAD and SVG to the diagonal, and SVG to the circumflex marginal, and SVG to the PDA. Recently, the patient had developed progressive shortness of breath with activity.  A nuclear study demonstrated significant ischemia inferiorly without lateral ischemia.  He underwent cardiac catheterization today by Dr. Rennis Golden, which confirmed graft occlusion of his vein graft supplying the RCA as well as graft occlusion which was verified by me with the vein grafts which has supplied the diagonal and circumflex vessel.  He had a patent LIMA to the LAD.  I was asked to perform intervention to his proximal native RCA which had previously noted 95% stenosis with previously documented 60% mid stenoses, which had not significantly changed.  PROCEDURE:  Right femoral artery sheath was in place from the diagnostic procedure.  I did give the patient additional 1 mg of Versed plus 25 mcg of fentanyl.  Initially, the 5-French diagnostic left bypass graft catheter was inserted, and I was able to confirm graft occlusion of both the vein graft supplying the diagonal as well as the vein graft supplying the circumflex vessel.  Attention was then directed at the intervention to the right coronary artery.  The arterial sheath was upgraded to a 6-French system.  Angiomax bolus plus infusion  was administered and ACT was documented to be therapeutic.  The patient received 180 mg of oral Brilinta.  A 6-French FR4 guide with side holes was used for the interventional procedure.  A Prowater wire was able to be advanced down the native right coronary artery.  Initial dilatation of the proximal stenosis was done with a 2.5 x 12 mm Emerge balloon with dilatation to 5, 9, and 10 atmospheres.  Multiple additional views were made to assess whether or not intervention needed to be done to the mid RCA per Dr. Rennis Golden who was in the room who had done the diagnostic procedure.  It seemed to appear that the stenosis was approximately 60%, but this was not that much different from previously and consequently after discussion with Dr. Rennis Golden the decision was not to attempt intervention to the mid vessel, but only to concentrate on the proximal segment.  A Xience 3.0 x 15 mm Xpedition DES stent was then inserted, and 2 inflations at 11 and 12 atmospheres were made in the very proximal right coronary artery.  A 3.5 x 12 mm Sprinter noncompliant balloon was used for post stent dilatation within the entire proximal stented segment.  However, since there was an area of ectasia, which was present in the vessel where the distal stent entered into,  it was felt that this needed to be dilated a little bit larger and a 3.75 x 8 mm noncompliant TREK was used for dilatation to the distal aspect of the stent up to approximately 3.75 mm.  Scout angiography confirmed an excellent angiographic result.  There was no change in the previously noted mid stenoses as noted above.  During the procedure, the patient did receive several doses of intracoronary nitroglycerin.  In addition, he was started on intravenous fluids at 150 mL an hour after 200 mg bolus since his EDP was low at 10 during his diagnostic catheterization.  Scout angiography confirmed an excellent angiographic result.  The patient's sheath was sutured  in place with plans for sheath removal later today. The patient left the catheterization laboratory with plans for 2 hours of reduced rate Angiomax infusion prior to complete discontinuance.  HEMODYNAMIC DATA:  Central blood pressure was 100/50.  ANGIOGRAPHIC DATA:  Please refer to Dr. Blanchie Dessert diagnostic catheterization report.  However, prior to beginning the intervention, I was able to verify and document that both the vein graft to the circumflex vessel and the vein graft to the diagonal vessel were occluded with "nubbins."  At the start of the interventional procedure to the right coronary artery, the native right coronary artery had 95% proximal stenosis shortly after its ostium which then seemed to end in an area of ectasia. There was tortuosity with narrowing of 50% to 60% in the mid RCA diffusely which had been present previously.  On one-view perhaps there was one area that was 60% to 70% narrowed that was smooth in the right lateral projection.  The right coronary supplied the PDA vessel then into the posterolateral type vessel.  There was mild 20% narrowing after the PDA takeoff.  Following intervention, the 95% proximal stenosis was reduced to 0% with ultimate insertion of a 3.0 x 15 mm Xience Xpedition DES stent post dilated to approximately 3.75 mm in the distal aspect of the stent, which seemed to go into the ectatic proximal RCA region and the remaining portion of the stent was dilated to approximately 3.6 mm. There was brisk TIMI-3 flow.  There was no evidence for dissection.  IMPRESSION:  Successful percutaneous coronary intervention to the native right coronary artery with a 95% proximal stenosis being reduced to 0% with ultimate insertion of a 3.0 x 15 mm Xience Xpedition DES stent post dilated to approximately 3.75 to 3.6 mm done with bivalirudin/Brilinta 180 mg in this patient on aspirin, IC nitroglycerin.  DISCUSSION:  The angiographic findings were reviewed  with Dr. Rennis Golden. Dr. Rennis Golden plans to subsequently perform a later stress test in light of the patient's concomitant CAD particularly with his left main involvement to hopefully document improvement in his previous significant inferior ischemia, and to make certain he does not have lateral ischemia from a circumflex territory.          ______________________________ Nicki Guadalajara, M.D.     TK/MEDQ  D:  06/14/2012  T:  06/15/2012  Job:  [id number]  cc:   [place]aly Hilty, MD Madelin Rear. Sherwood Gambler, MD

## 2012-07-27 ENCOUNTER — Encounter: Payer: Self-pay | Admitting: Internal Medicine

## 2012-09-10 ENCOUNTER — Ambulatory Visit (HOSPITAL_COMMUNITY)
Admission: RE | Admit: 2012-09-10 | Discharge: 2012-09-10 | Disposition: A | Discharge status: Home / Self Care | Payer: Medicare Other | Source: Ambulatory Visit | Attending: Family Medicine | Admitting: Family Medicine

## 2012-09-10 ENCOUNTER — Other Ambulatory Visit (HOSPITAL_COMMUNITY): Payer: Self-pay | Admitting: Family Medicine

## 2012-09-10 DIAGNOSIS — R05 Cough: Secondary | ICD-10-CM | POA: Insufficient documentation

## 2012-09-10 DIAGNOSIS — R0602 Shortness of breath: Secondary | ICD-10-CM | POA: Insufficient documentation

## 2012-09-10 DIAGNOSIS — R509 Fever, unspecified: Secondary | ICD-10-CM | POA: Insufficient documentation

## 2012-09-10 DIAGNOSIS — J069 Acute upper respiratory infection, unspecified: Secondary | ICD-10-CM | POA: Insufficient documentation

## 2012-09-10 DIAGNOSIS — R059 Cough, unspecified: Secondary | ICD-10-CM | POA: Insufficient documentation

## 2012-09-10 DIAGNOSIS — R918 Other nonspecific abnormal finding of lung field: Secondary | ICD-10-CM | POA: Insufficient documentation

## 2012-09-12 ENCOUNTER — Emergency Department (HOSPITAL_COMMUNITY)
Admission: EM | Admit: 2012-09-12 | Discharge: 2012-09-12 | Disposition: A | Discharge status: Home / Self Care | Payer: Medicare Other | Attending: Emergency Medicine | Admitting: Emergency Medicine

## 2012-09-12 ENCOUNTER — Emergency Department (HOSPITAL_COMMUNITY): Payer: Medicare Other

## 2012-09-12 ENCOUNTER — Encounter (HOSPITAL_COMMUNITY): Payer: Self-pay | Admitting: Emergency Medicine

## 2012-09-12 DIAGNOSIS — Z79899 Other long term (current) drug therapy: Secondary | ICD-10-CM | POA: Insufficient documentation

## 2012-09-12 DIAGNOSIS — Z87891 Personal history of nicotine dependence: Secondary | ICD-10-CM | POA: Insufficient documentation

## 2012-09-12 DIAGNOSIS — Z860100 Personal history of colon polyps, unspecified: Secondary | ICD-10-CM | POA: Insufficient documentation

## 2012-09-12 DIAGNOSIS — Z8639 Personal history of other endocrine, nutritional and metabolic disease: Secondary | ICD-10-CM | POA: Insufficient documentation

## 2012-09-12 DIAGNOSIS — Z951 Presence of aortocoronary bypass graft: Secondary | ICD-10-CM | POA: Insufficient documentation

## 2012-09-12 DIAGNOSIS — Z7982 Long term (current) use of aspirin: Secondary | ICD-10-CM | POA: Insufficient documentation

## 2012-09-12 DIAGNOSIS — I129 Hypertensive chronic kidney disease with stage 1 through stage 4 chronic kidney disease, or unspecified chronic kidney disease: Secondary | ICD-10-CM | POA: Insufficient documentation

## 2012-09-12 DIAGNOSIS — R110 Nausea: Secondary | ICD-10-CM | POA: Insufficient documentation

## 2012-09-12 DIAGNOSIS — Z862 Personal history of diseases of the blood and blood-forming organs and certain disorders involving the immune mechanism: Secondary | ICD-10-CM | POA: Insufficient documentation

## 2012-09-12 DIAGNOSIS — J189 Pneumonia, unspecified organism: Secondary | ICD-10-CM

## 2012-09-12 DIAGNOSIS — Z8739 Personal history of other diseases of the musculoskeletal system and connective tissue: Secondary | ICD-10-CM | POA: Insufficient documentation

## 2012-09-12 DIAGNOSIS — J159 Unspecified bacterial pneumonia: Secondary | ICD-10-CM | POA: Insufficient documentation

## 2012-09-12 DIAGNOSIS — N189 Chronic kidney disease, unspecified: Secondary | ICD-10-CM | POA: Insufficient documentation

## 2012-09-12 DIAGNOSIS — R509 Fever, unspecified: Secondary | ICD-10-CM | POA: Insufficient documentation

## 2012-09-12 DIAGNOSIS — I2510 Atherosclerotic heart disease of native coronary artery without angina pectoris: Secondary | ICD-10-CM | POA: Insufficient documentation

## 2012-09-12 DIAGNOSIS — Z8679 Personal history of other diseases of the circulatory system: Secondary | ICD-10-CM | POA: Insufficient documentation

## 2012-09-12 LAB — CBC WITH DIFFERENTIAL/PLATELET
HCT: 45.7 % (ref 39.0–52.0)
Hemoglobin: 16 g/dL (ref 13.0–17.0)
Lymphs Abs: 0.9 10*3/uL (ref 0.7–4.0)
MCH: 31.4 pg (ref 26.0–34.0)
Monocytes Absolute: 0.8 10*3/uL (ref 0.1–1.0)
Monocytes Relative: 8 % (ref 3–12)
Neutro Abs: 9.1 10*3/uL — ABNORMAL HIGH (ref 1.7–7.7)
Neutrophils Relative %: 84 % — ABNORMAL HIGH (ref 43–77)
RBC: 5.1 MIL/uL (ref 4.22–5.81)

## 2012-09-12 LAB — BASIC METABOLIC PANEL
Chloride: 94 mEq/L — ABNORMAL LOW (ref 96–112)
GFR calc Af Amer: >90 mL/min (ref >90)
GFR calc non Af Amer: 79 mL/min — ABNORMAL LOW (ref >90)
Glucose, Bld: 170 mg/dL — ABNORMAL HIGH (ref 70–99)
Potassium: 3.9 mEq/L (ref 3.5–5.1)
Sodium: 130 mEq/L — ABNORMAL LOW (ref 135–145)

## 2012-09-12 LAB — D-DIMER, QUANTITATIVE: D-Dimer, Quant: 1.58 ug/mL-FEU — ABNORMAL HIGH (ref 0.00–0.48)

## 2012-09-12 MED ORDER — IPRATROPIUM BROMIDE 0.02 % IN SOLN
0.5000 mg | Freq: Once | RESPIRATORY_TRACT | Status: AC
  Administered 2012-09-12: 0.5 mg via RESPIRATORY_TRACT
  Filled 2012-09-12: qty 2.5

## 2012-09-12 MED ORDER — ACETAMINOPHEN 500 MG PO TABS
ORAL_TABLET | ORAL | Status: AC
  Administered 2012-09-12: 1000 mg via ORAL
  Filled 2012-09-12: qty 2

## 2012-09-12 MED ORDER — AZITHROMYCIN 250 MG PO TABS: ORAL_TABLET | ORAL | Status: DC

## 2012-09-12 MED ORDER — DEXTROSE 5 % IV SOLN
1.0000 g | Freq: Once | INTRAVENOUS | Status: AC
  Administered 2012-09-12: 1 g via INTRAVENOUS
  Filled 2012-09-12: qty 10

## 2012-09-12 MED ORDER — DEXTROSE 5 % IV SOLN
500.0000 mg | Freq: Once | INTRAVENOUS | Status: AC
  Administered 2012-09-12: 500 mg via INTRAVENOUS
  Filled 2012-09-12: qty 500

## 2012-09-12 MED ORDER — ALBUTEROL SULFATE (5 MG/ML) 0.5% IN NEBU
2.5000 mg | INHALATION_SOLUTION | Freq: Once | RESPIRATORY_TRACT | Status: AC
  Administered 2012-09-12: 2.5 mg via RESPIRATORY_TRACT
  Filled 2012-09-12: qty 1

## 2012-09-12 MED ORDER — ACETAMINOPHEN 500 MG PO TABS: 1000.0000 mg | ORAL_TABLET | Freq: Once | ORAL | Status: AC

## 2012-09-12 NOTE — ED Notes (Addendum)
Patient c/o shortness of breath since Saturday. Reports seeing PA Monday and giving breathing treatment and inhaler. Had chest x-ray which showed possible pneumonia in which they gave him Augmentin.  Patient was told to call if no better and PA instructed him to coms here. Patient used inhaler on way to hospital but denies any relief. Patient reports nausea and fevers. Denies any chest pain. Patient does have hx of bypass surgery.

## 2012-09-12 NOTE — ED Provider Notes (Signed)
History  This chart was scribed for Donnetta Hutching, MD by Bennett Scrape, ED Scribe. This patient was seen in room APA18/APA18 and the patient's care was started at 9:51 AM.  CSN: [id number] Arrival date & time 09/12/12  9562   First MD Initiated Contact with Patient 09/12/12 [phone]     Chief Complaint  Patient presents with  . Shortness of Breath    The history is provided by the patient. No language interpreter was used.    HPI Comments: Russell Patton is a 72 y.o. male who presents to the Emergency Department complaining of 2 days of gradual onset, gradually worsening, constant SOB with associated nausea and fevers. The SOB is not affected by positions but is worsened by exertion. He was seen by the [facility] PA yesterday, had a CXR with possible PNA and he was given an albuterol inhaler, augmentin and a breathing treatment. He has not had his antibiotic dose this morning. He used the inhaler PTA with no improvement. Currently, pt states that he can walk 100 feet prior to becoming SOB. At baseline, he has no SOB with exertion. He denies wheezing and CP as associated symptoms. He has a h/o CABG performed one year ago and is currently on Brilinta and ASA currently.  Past Medical History  Diagnosis Date  . Hypertension   . Carotid artery stenosis   . Hyperlipidemia   . Coronary artery disease     CABG x4 2012  . 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 Arthritis   . Smoker     quit  . History of adenomatous polyp of colon 2005/2008  . Subclavian artery stenosis, right   . Anginal pain   . Shortness of breath   . Chronic kidney disease    Past Surgical History  Procedure Laterality Date  . Appendectomy    . Cervical disc surgery    . Coronary artery bypass graft  01/29/2011    Procedure: CORONARY ARTERY BYPASS GRAFTING (CABG)TIMES FOUR;  Surgeon: Kathlee Nations Trigt III, MD;  Location: [facility] OR;  Service: Open Heart Surgery;  Laterality: N/A;  using left internal mammary artery and  endoscopically harvested right saphenous vein  . Coronary artery bypass graft  01/29/2011    Procedure: CORONARY ARTERY BYPASS GRAFTING (CABG)TIMES THREE;  Surgeon: Mikey Bussing, MD;  Location: [facility] OR;  Service: Open Heart Surgery;  Laterality: N/A;  . Colonoscopy  01/15/07    normal rectum/cecal/descending colon polyps. adenomatous polyp  . Colonoscopy  10/20/03    normal rectum/sigmoid diverticula, polyps at splenic flexure and at cecum, adenomatous polyp  . Colonoscopy  06/08/2011    Procedure: COLONOSCOPY;  Surgeon: Corbin Ade, MD;  [facility]: [facility];  Service: Endoscopy;  Laterality: N/A;  8:30  . Cystoscopy  1970's   Family History  Problem Relation Age of Onset  . Cancer Father 51    colon  . Heart attack Father   . Dementia Mother    History  Substance Use Topics  . Smoking status: Former Smoker    Types: Cigarettes    Quit date: 03/22/1999  . Smokeless tobacco: Not on file  . Alcohol Use: 0.0 oz/week    0 Cans of beer per week     Comment: wine couple of times per month.    Review of Systems  A complete 10 system review of systems was obtained and all systems are negative except as noted in the HPI and PMH.   Allergies  Statins  Home  Medications   Current Outpatient Rx  Name  Route  Sig  Dispense  Refill  . aspirin EC 81 MG tablet   Oral   Take 81 mg by mouth daily.         Marland Kitchen ezetimibe (ZETIA) 10 MG tablet   Oral   Take 10 mg by mouth every evening.         Marland Kitchen lisinopril (PRINIVIL,ZESTRIL) 10 MG tablet   Oral   Take 10 mg by mouth daily.         . metoprolol tartrate (LOPRESSOR) 25 MG tablet   Oral   Take 25 mg by mouth every evening.          . Multiple Vitamin (MULTIVITAMIN WITH MINERALS) TABS   Oral   Take 1 tablet by mouth daily.         . nitroGLYCERIN (NITROSTAT) 0.4 MG SL tablet   Sublingual   Place 0.4 mg under the tongue every 5 (five) minutes as needed for chest pain.         . Ticagrelor (BRILINTA) 90 MG TABS  tablet   Oral   Take 1 tablet (90 mg total) by mouth 2 (two) times daily.   60 tablet   10   . Ticagrelor (BRILINTA) 90 MG TABS tablet   Oral   Take 1 tablet (90 mg total) by mouth 2 (two) times daily.   60 tablet   0     This is for free 30 day supply    Triage Vitals: BP 147/75  Pulse 108  Temp(Src) 98.3 F (36.8 C) (Oral)  Resp 24  Ht 5\' 7"  (1.702 m)  Wt 192 lb (87.091 kg)  BMI 30.06 kg/m2  SpO2 94%  Physical Exam  Nursing note and vitals reviewed. Constitutional: He is oriented to person, place, and time. He appears well-developed and well-nourished.  Pt appeared slightly dyspneic   HENT:  Head: Normocephalic and atraumatic.  Eyes: Conjunctivae and EOM are normal. Pupils are equal, round, and reactive to light.  Neck: Normal range of motion. Neck supple.  Cardiovascular: Normal rate, regular rhythm and normal heart sounds.   Pulmonary/Chest: Effort normal and breath sounds normal.  Abdominal: Soft. Bowel sounds are normal.  Musculoskeletal: Normal range of motion.  Neurological: He is alert and oriented to person, place, and time.  Skin: Skin is warm and dry.  Psychiatric: He has a normal mood and affect.    ED Course  Procedures (including critical care time)  Medications  albuterol (PROVENTIL) (5 MG/ML) 0.5% nebulizer solution 2.5 mg (2.5 mg Nebulization Given 09/12/12 0958)  ipratropium (ATROVENT) nebulizer solution 0.5 mg (0.5 mg Nebulization Given 09/12/12 0958)    DIAGNOSTIC STUDIES: Oxygen Saturation is 94% on room air, adequate by my interpretation.    COORDINATION OF CARE: 9:55 AM-Discussed treatment plan which includes CXR, breathing treatment, CBC panel, CMP and UA with pt at bedside and pt agreed to plan.   Labs Reviewed  BASIC METABOLIC PANEL - Abnormal; Notable for the following:    Sodium 130 (*)    Chloride 94 (*)    Glucose, Bld 170 (*)    GFR calc non Af Amer 79 (*)    All other components within normal limits  CBC WITH DIFFERENTIAL -  Abnormal; Notable for the following:    WBC 10.8 (*)    Neutrophils Relative % 84 (*)    Neutro Abs 9.1 (*)    Lymphocytes Relative 8 (*)    All other components  within normal limits  D-DIMER, QUANTITATIVE - Abnormal; Notable for the following:    D-Dimer, Quant 1.58 (*)    All other components within normal limits  TROPONIN I   Dg Chest 2 View  09/12/2012   *RADIOLOGY REPORT*  Clinical Data: Shortness of breath.  Former smoker with history of hypertension and bypass surgery.  CHEST - 2 VIEW  Comparison: 09/10/2012 and 05/30/2012.  Findings: The heart size and mediastinal contours are stable status post CABG.  There is chronic lung disease with scattered central airway thickening and parenchymal scarring.  No significant worsening of the superimposed right lower lobe air space disease is identified.  There is no significant pleural effusion.  Previous cervical fusion has been performed.  IMPRESSION: No significant change in right lower lobe air space disease superimposed on chronic lung disease.  No significant pleural effusion.   Original Report Authenticated By: Carey Bullocks, M.D.   No diagnosis found.    . Date: 09/12/2012  Rate: 111  Rhythm: sinus tachy  QRS Axis: normal  Intervals: normal  ST/T Wave abnormalities: normal  Conduction Disutrbances: Incomplete right bundle branch block  Narrative Interpretation: unremarkable    MDM  Patient is hemodynamically stable.  Discussed right lower lobe pneumonia.  IV Rocephin, IV Zithromax. Patient desires to go home. He understands to return if any change in his status.  Discharge antibiotic Zithromax  I personally performed the services described in this documentation, which was scribed in my presence. The recorded information has been reviewed and is accurate.    Donnetta Hutching, MD 09/12/12 [phone]

## 2012-10-01 ENCOUNTER — Other Ambulatory Visit (HOSPITAL_COMMUNITY): Payer: Self-pay | Admitting: Internal Medicine

## 2012-10-01 DIAGNOSIS — Z0000 Encounter for general adult medical examination without abnormal findings: Secondary | ICD-10-CM

## 2012-10-04 ENCOUNTER — Ambulatory Visit (HOSPITAL_COMMUNITY)
Admission: RE | Admit: 2012-10-04 | Discharge: 2012-10-04 | Disposition: A | Discharge status: Home / Self Care | Payer: Medicare Other | Source: Ambulatory Visit | Attending: Internal Medicine | Admitting: Internal Medicine

## 2012-10-04 DIAGNOSIS — Z1389 Encounter for screening for other disorder: Secondary | ICD-10-CM | POA: Insufficient documentation

## 2012-10-04 DIAGNOSIS — Z0000 Encounter for general adult medical examination without abnormal findings: Secondary | ICD-10-CM

## 2012-12-19 ENCOUNTER — Encounter: Payer: Self-pay | Admitting: Internal Medicine

## 2012-12-19 ENCOUNTER — Ambulatory Visit (INDEPENDENT_AMBULATORY_CARE_PROVIDER_SITE_OTHER): Payer: Medicare Other | Admitting: Internal Medicine

## 2012-12-19 VITALS — BP 142/72 | HR 66 | Ht 68.0 in | Wt 199.2 lb

## 2012-12-19 DIAGNOSIS — Z951 Presence of aortocoronary bypass graft: Secondary | ICD-10-CM

## 2012-12-19 DIAGNOSIS — Z955 Presence of coronary angioplasty implant and graft: Secondary | ICD-10-CM | POA: Insufficient documentation

## 2012-12-19 DIAGNOSIS — I2510 Atherosclerotic heart disease of native coronary artery without angina pectoris: Secondary | ICD-10-CM

## 2012-12-19 DIAGNOSIS — R9439 Abnormal result of other cardiovascular function study: Secondary | ICD-10-CM

## 2012-12-19 DIAGNOSIS — Z9861 Coronary angioplasty status: Secondary | ICD-10-CM

## 2012-12-19 MED ORDER — CLOPIDOGREL BISULFATE 75 MG PO TABS: 75.0000 mg | ORAL_TABLET | Freq: Every day | ORAL | Status: DC

## 2012-12-19 MED ORDER — EZETIMIBE 10 MG PO TABS: 10.0000 mg | ORAL_TABLET | Freq: Every evening | ORAL | Status: DC

## 2012-12-19 NOTE — Progress Notes (Addendum)
OFFICE NOTE  Chief Complaint:  Dyspnea, routine follow-up  Primary Care Physician: Cassell Smiles., MD  HPI:  Russell Patton is a 72 year old gentleman who was seen by my partner for complaints of bilateral chest tightness and shortness of breath. He has a history of coronary disease and had 4-vessel CABG in November of 2012. He had some symptoms of improvement in his shortness of breath which was the symptom he had after bypass surgery, and that symptom has actually gotten worse. He recently underwent a stress test in our office which demonstrated a reversible inferior defect and 4-5 mm of significant ST depression during exercise. A cardiac catheterization was recommended. He did undergo heart catheterization on June 14, 2012 by myself. This demonstrated multi-vessel coronary disease with severe distal left main stenosis and high grade proximal RCA stenosis. There was moderate mid RCA stenosis. The vein graft to the diagonal was occluded as well as the vein graft to OM2 and the PDA. Left ventricular end diastolic pressure was 6.   Based on these findings with all 3 vein grafts occluded and a patent LIMA to LAD we recommended intervention to the native coronary circulation. After reviewing the cath films with Dr. Tresa Endo we recommended intervention to the proximal native RCA which had a 95% stenosis and a 60% unchanged mid vessel stenosis. He did undergo successful percutaneous intervention and stenting to the proximal RCA with a Xience Expedition drug-eluting stent post dilated about 3.75 mm. The patient tolerated the procedure well and subsequently had a marked improvement in his shortness of breath. There was questionable ischemia in the circumflex territory circulation, however, it was thought that this possibly could be back flowing from the LIMA and LAD system. Given the fact that there was not an abnormality on the nuclear stress test it was felt that we did not need to pursue  revascularization in that area unless the patient had worsening symptoms.   He returns today for six-month followup and continues to have some shortness of breath. Although he says it is much better than it was before he underwent his stent, it has not totally resolved. He denies any angina. He is concerned somewhat about Brillinta possibly causing some of the shortness of breath.  PMHx:  Past Medical History  Diagnosis Date  . Hypertension   . Carotid artery stenosis   . Hyperlipidemia   . Coronary artery disease     CABG x4 2012  . 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 Arthritis   . Smoker     quit  . History of adenomatous polyp of colon 2005/2008  . Subclavian artery stenosis, right   . Anginal pain   . Shortness of breath   . Chronic kidney disease     Past Surgical History  Procedure Laterality Date  . Appendectomy    . Cervical disc surgery    . Coronary artery bypass graft  01/29/2011    Procedure: CORONARY ARTERY BYPASS GRAFTING (CABG)TIMES FOUR;  Surgeon: Kathlee Nations Trigt III, MD;  Location: [facility];  Service: Open Heart Surgery;  Laterality: N/A;  using left internal mammary artery and endoscopically harvested right saphenous vein  . Coronary artery bypass graft  01/29/2011    Procedure: CORONARY ARTERY BYPASS GRAFTING (CABG)TIMES THREE;  Surgeon: Mikey Bussing, MD;  Location: [facility] OR;  Service: Open Heart Surgery;  Laterality: N/A;  . Colonoscopy  01/15/07    normal rectum/cecal/descending colon polyps. adenomatous polyp  . Colonoscopy  10/20/03  normal rectum/sigmoid diverticula, polyps at splenic flexure and at cecum, adenomatous polyp  . Colonoscopy  06/08/2011    Procedure: COLONOSCOPY;  Surgeon: Corbin Ade, MD;  [facility]: [facility];  Service: Endoscopy;  Laterality: N/A;  8:30  . Cystoscopy  1970's    FAMHx:  Family History  Problem Relation Age of Onset  . Cancer Father 3    colon  . Heart attack Father   . Dementia Mother     SOCHx:    reports that he quit smoking about 13 years ago. His smoking use included Cigarettes. He smoked 0.00 packs per day. He has never used smokeless tobacco. He reports that  drinks alcohol. He reports that he does not use illicit drugs.  ALLERGIES:  Allergies  Allergen Reactions  . Statins Other (See Comments)    Muscle aches     ROS: A comprehensive review of systems was negative except for: Respiratory: positive for dyspnea on exertion  HOME MEDS: Current Outpatient Prescriptions  Medication Sig Dispense Refill  . aspirin EC 81 MG tablet Take 81 mg by mouth daily.      . clopidogrel (PLAVIX) 75 MG tablet Take 1 tablet (75 mg total) by mouth daily.  90 tablet  3  . ezetimibe (ZETIA) 10 MG tablet Take 1 tablet (10 mg total) by mouth every evening.  42 tablet  0  . lisinopril (PRINIVIL,ZESTRIL) 10 MG tablet Take 10 mg by mouth daily.      . metoprolol tartrate (LOPRESSOR) 25 MG tablet Take 25 mg by mouth every evening.       . Multiple Vitamin (MULTIVITAMIN WITH MINERALS) TABS Take 1 tablet by mouth daily.      . nitroGLYCERIN (NITROSTAT) 0.4 MG SL tablet Place 0.4 mg under the tongue every 5 (five) minutes as needed for chest pain.       No current facility-administered medications for this visit.    LABS/IMAGING: No results found for this or any previous visit (from the past 48 hour(s)). No results found.  VITALS: BP 142/72  Pulse 66  Ht 5\' 8"  (1.727 m)  Wt 199 lb 3.2 oz (90.357 kg)  BMI 30.3 kg/m2  EXAM: General appearance: alert and no distress Neck: no adenopathy, no carotid bruit, no JVD, supple, symmetrical, trachea midline and thyroid not enlarged, symmetric, no tenderness/mass/nodules Lungs: clear to auscultation bilaterally Heart: regular rate and rhythm Abdomen: soft, non-tender; bowel sounds normal; no masses,  no organomegaly Extremities: extremities normal, atraumatic, no cyanosis or edema Pulses: 2+ and symmetric Skin: Skin color, texture, turgor normal. No  rashes or lesions Neurologic: Grossly normal Psych: Mood, affect normal  EKG: Normal sinus rhythm at 66  ASSESSMENT: 1. Coronary artery disease status post four-vessel CABG in 2012 (with only a patent LIMA to LAD by heart cath in 2014) 2. Status post PCI with drug-eluting stent to the right coronary artery in 2014 3. Persistent dyspnea, however improved after PCI 4. Hypertension 5. Dyslipidemia 6. Obesity 7. Statin intolerant  PLAN: 1.   Mr. Stormont continues to have some mild dyspnea which is notable during his exercise and exertion. It's worse when walking up hills. He is concerned about Brillinta possibly causing his symptoms. At this point since he is 6 months out from his stent, would recommend stopping that medication and switching him to Plavix. He'll need to continue this with aspirin for another 6 months. I've encouraged him to continue to do exercise. Unfortunately he statin intolerant, but he is able to take that  he had. I went ahead and gave him samples of that today. He should continue to work on weight loss. His blood pressure is well-controlled at this point. A plan to see him back in 6 months is tender as necessary.  Chrystie Nose, MD, [facility] Attending Cardiologist [facility]  HILTY,Kenneth C 12/19/2012, 3:54 PM

## 2012-12-19 NOTE — Patient Instructions (Addendum)
Dr. Rennis Golden wants you to follow-up in: 6 months. You will receive a reminder letter in the mail two months in advance. If you don't receive a letter, please call our office to schedule the follow-up appointment.   Stop Brilinta. Begin taking Plavix 75mg  daily.

## 2013-03-18 ENCOUNTER — Telehealth: Payer: Self-pay | Admitting: Internal Medicine

## 2013-03-18 MED ORDER — EZETIMIBE 10 MG PO TABS: 10.0000 mg | ORAL_TABLET | Freq: Every evening | ORAL | Status: DC

## 2013-03-18 NOTE — Telephone Encounter (Signed)
Could you please send a new prescription to [facility] for a 90 day prescription for Zeita 10mg  ..Any questions please call  Thanks

## 2013-03-18 NOTE — Telephone Encounter (Signed)
Rx was sent to pharmacy electronically. 

## 2013-04-29 ENCOUNTER — Other Ambulatory Visit: Payer: Self-pay | Admitting: Internal Medicine

## 2013-04-29 NOTE — Telephone Encounter (Signed)
Rx was sent to pharmacy electronically. 

## 2013-06-10 ENCOUNTER — Ambulatory Visit (HOSPITAL_COMMUNITY)
Admission: RE | Admit: 2013-06-10 | Discharge: 2013-06-10 | Disposition: A | Discharge status: Home / Self Care | Payer: Medicare Other | Source: Ambulatory Visit | Attending: Internal Medicine | Admitting: Internal Medicine

## 2013-06-10 ENCOUNTER — Other Ambulatory Visit (HOSPITAL_COMMUNITY): Payer: Self-pay | Admitting: Internal Medicine

## 2013-06-10 DIAGNOSIS — R0609 Other forms of dyspnea: Secondary | ICD-10-CM

## 2013-06-10 DIAGNOSIS — R05 Cough: Secondary | ICD-10-CM | POA: Insufficient documentation

## 2013-06-10 DIAGNOSIS — R0989 Other specified symptoms and signs involving the circulatory and respiratory systems: Principal | ICD-10-CM

## 2013-06-10 DIAGNOSIS — R059 Cough, unspecified: Secondary | ICD-10-CM | POA: Insufficient documentation

## 2013-06-10 DIAGNOSIS — R0602 Shortness of breath: Secondary | ICD-10-CM | POA: Insufficient documentation

## 2013-06-10 DIAGNOSIS — J984 Other disorders of lung: Secondary | ICD-10-CM | POA: Insufficient documentation

## 2013-06-27 ENCOUNTER — Encounter: Payer: Self-pay | Admitting: Internal Medicine

## 2013-06-27 ENCOUNTER — Ambulatory Visit (INDEPENDENT_AMBULATORY_CARE_PROVIDER_SITE_OTHER): Payer: Medicare Other | Admitting: Internal Medicine

## 2013-06-27 VITALS — BP 130/82 | HR 76 | Ht 69.0 in | Wt 204.2 lb

## 2013-06-27 DIAGNOSIS — R0609 Other forms of dyspnea: Secondary | ICD-10-CM

## 2013-06-27 DIAGNOSIS — R0989 Other specified symptoms and signs involving the circulatory and respiratory systems: Secondary | ICD-10-CM

## 2013-06-27 DIAGNOSIS — I2510 Atherosclerotic heart disease of native coronary artery without angina pectoris: Secondary | ICD-10-CM

## 2013-06-27 DIAGNOSIS — E785 Hyperlipidemia, unspecified: Secondary | ICD-10-CM

## 2013-06-27 DIAGNOSIS — Z951 Presence of aortocoronary bypass graft: Secondary | ICD-10-CM

## 2013-06-27 DIAGNOSIS — I10 Essential (primary) hypertension: Secondary | ICD-10-CM

## 2013-06-27 MED ORDER — METOPROLOL TARTRATE 25 MG PO TABS: 25.0000 mg | ORAL_TABLET | Freq: Two times a day (BID) | ORAL | Status: DC

## 2013-06-27 NOTE — Progress Notes (Signed)
OFFICE NOTE  Chief Complaint:  Dyspnea, routine follow-up  Primary Care Physician: Glo Herring., MD  HPI:  Russell Patton is a 73 year old gentleman who was seen by my partner for complaints of bilateral chest tightness and shortness of breath. He has a history of coronary disease and had 4-vessel CABG in November of 2012. He had some symptoms of improvement in his shortness of breath which was the symptom he had after bypass surgery, and that symptom has actually gotten worse. He recently underwent a stress test in our office which demonstrated a reversible inferior defect and 4-5 mm of significant ST depression during exercise. A cardiac catheterization was recommended. He did undergo heart catheterization on June 14, 2012 by myself. This demonstrated multi-vessel coronary disease with severe distal left main stenosis and high grade proximal RCA stenosis. There was moderate mid RCA stenosis. The vein graft to the diagonal was occluded as well as the vein graft to OM2 and the PDA. Left ventricular end diastolic pressure was 6.   Based on these findings with all 3 vein grafts occluded and a patent LIMA to LAD we recommended intervention to the native coronary circulation. After reviewing the cath films with Dr. Claiborne Billings we recommended intervention to the proximal native RCA which had a 95% stenosis and a 60% unchanged mid vessel stenosis. He did undergo successful percutaneous intervention and stenting to the proximal RCA with a Xience Expedition drug-eluting stent post dilated about 3.75 mm. The patient tolerated the procedure well and subsequently had a marked improvement in his shortness of breath. There was questionable ischemia in the circumflex territory circulation, however, it was thought that this possibly could be back flowing from the LIMA and LAD system. Given the fact that there was not an abnormality on the nuclear stress test it was felt that we did not need to pursue  revascularization in that area unless the patient had worsening symptoms.   He returns today for six-month followup. He reports since switching from Brillinta to Plavix it is shortness of breath has improved.  Overall he seems to be doing pretty well. He is noted to have PACs and his EKG today. He says he is taking metoprolol tartrate only at night due to low blood pressure the morning when taking losartan.  PMHx:  Past Medical History  Diagnosis Date  . Hypertension   . Carotid artery stenosis   . Hyperlipidemia   . Coronary artery disease     CABG x4 2012  . 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 Arthritis   . Smoker     quit  . History of adenomatous polyp of colon 2005/2008  . Subclavian artery stenosis, right   . Anginal pain   . Shortness of breath   . Chronic kidney disease     Past Surgical History  Procedure Laterality Date  . Appendectomy    . Cervical disc surgery    . Coronary artery bypass graft  01/29/2011    Procedure: CORONARY ARTERY BYPASS GRAFTING (CABG)TIMES FOUR;  Surgeon: Tharon Aquas Trigt III, MD;  Location: [place];  Service: Open Heart Surgery;  Laterality: N/A;  using left internal mammary artery and endoscopically harvested right saphenous vein  . Coronary artery bypass graft  01/29/2011    Procedure: CORONARY ARTERY BYPASS GRAFTING (CABG)TIMES THREE;  Surgeon: Len Childs, MD;  Location: [facility];  Service: Open Heart Surgery;  Laterality: N/A;  . Colonoscopy  01/15/07    normal rectum/cecal/descending colon polyps. adenomatous  polyp  . Colonoscopy  10/20/03    normal rectum/sigmoid diverticula, polyps at splenic flexure and at cecum, adenomatous polyp  . Colonoscopy  06/08/2011    Procedure: COLONOSCOPY;  Surgeon: Daneil Dolin, MD;  [facility];  Service: Endoscopy;  Laterality: N/A;  8:30  . Cystoscopy  1970's    FAMHx:  Family History  Problem Relation Age of Onset  . Cancer Father 14    colon  . Heart attack Father   . Dementia  Mother     SOCHx:   reports that he quit smoking about 14 years ago. His smoking use included Cigarettes. He smoked 0.00 packs per day. He has never used smokeless tobacco. He reports that he drinks alcohol. He reports that he does not use illicit drugs.  ALLERGIES:  Allergies  Allergen Reactions  . Statins Other (See Comments)    Muscle aches     ROS: A comprehensive review of systems was negative except for: Respiratory: positive for dyspnea on exertion  HOME MEDS: Current Outpatient Prescriptions  Medication Sig Dispense Refill  . clopidogrel (PLAVIX) 75 MG tablet Take 1 tablet (75 mg total) by mouth daily.  90 tablet  3  . ezetimibe (ZETIA) 10 MG tablet Take 1 tablet (10 mg total) by mouth every evening.  90 tablet  2  . metoprolol tartrate (LOPRESSOR) 25 MG tablet Take 1 tablet (25 mg total) by mouth 2 (two) times daily.  180 tablet  3  . Multiple Vitamin (MULTIVITAMIN WITH MINERALS) TABS Take 1 tablet by mouth daily.      . nitroGLYCERIN (NITROSTAT) 0.4 MG SL tablet Place 0.4 mg under the tongue every 5 (five) minutes as needed for chest pain.       No current facility-administered medications for this visit.    LABS/IMAGING: No results found for this or any previous visit (from the past 48 hour(s)). No results found.  VITALS: BP 130/82  Pulse 76  Ht 5\' 9"  (1.753 m)  Wt 204 lb 3.2 oz (92.625 kg)  BMI 30.14 kg/m2  EXAM: General appearance: alert and no distress Neck: no adenopathy, no carotid bruit, no JVD, supple, symmetrical, trachea midline and thyroid not enlarged, symmetric, no tenderness/mass/nodules Lungs: clear to auscultation bilaterally Heart: regular rate and rhythm Abdomen: soft, non-tender; bowel sounds normal; no masses,  no organomegaly Extremities: extremities normal, atraumatic, no cyanosis or edema Pulses: 2+ and symmetric Skin: Skin color, texture, turgor normal. No rashes or lesions Neurologic: Grossly normal Psych: Mood, affect  normal  EKG: Normal sinus rhythm at 76  ASSESSMENT: 1. Coronary artery disease status post four-vessel CABG in 2012 (with only a patent LIMA to LAD by heart cath in 2014) 2. Status post PCI with drug-eluting stent to the right coronary artery in 2014 3. Mild dyspnea 4. Hypertension 5. Dyslipidemia 6. Obesity 7. Statin intolerant  PLAN: 1.   Mr. Anzalone has had improvement in his dyspnea. He reports that he bleeds very easily and is very interested in coming off of some of his blood thinners. I think is reasonable to consider discontinuing aspirin and keeping him on Plavix monotherapy. Reportedly still intolerant to statins and is on Zetia. He recently had laboratory work which showed a toe cholesterol 176, triglycerides 238 8, HDL 47 and LDL 81, which is reasonable control. He does have more PACs on his EKG today, therefore I would like you to start taking Lopressor 25 mg twice daily. I will discontinue losartan allow blood pressure room for this. Plan  to see him back in 6 months.  Pixie Casino, MD, [facility] Attending Cardiologist [facility] 06/27/2013, 9:16 AM

## 2013-06-27 NOTE — Patient Instructions (Signed)
Your physician has recommended you make the following change in your medication... 1. STOP aspirin.  2. STOP losartan.  3. INCREASE metoprolol tartrate to 25mg  twice daily.   Your physician recommends that you schedule a follow-up appointment in: 6 months.

## 2013-08-05 ENCOUNTER — Other Ambulatory Visit (HOSPITAL_COMMUNITY): Payer: Self-pay

## 2013-08-05 DIAGNOSIS — R0602 Shortness of breath: Secondary | ICD-10-CM

## 2013-08-28 ENCOUNTER — Ambulatory Visit (HOSPITAL_COMMUNITY)
Admission: RE | Admit: 2013-08-28 | Discharge: 2013-08-28 | Disposition: A | Discharge status: Home / Self Care | Payer: Medicare Other | Source: Ambulatory Visit | Attending: Pulmonary Disease | Admitting: Pulmonary Disease

## 2013-09-02 ENCOUNTER — Other Ambulatory Visit (HOSPITAL_COMMUNITY): Payer: Self-pay

## 2013-09-02 DIAGNOSIS — R0602 Shortness of breath: Secondary | ICD-10-CM

## 2013-09-06 ENCOUNTER — Other Ambulatory Visit (HOSPITAL_COMMUNITY): Payer: Self-pay | Admitting: Internal Medicine

## 2013-09-06 DIAGNOSIS — R221 Localized swelling, mass and lump, neck: Principal | ICD-10-CM

## 2013-09-06 DIAGNOSIS — R220 Localized swelling, mass and lump, head: Secondary | ICD-10-CM

## 2013-09-09 ENCOUNTER — Encounter (HOSPITAL_COMMUNITY): Payer: Self-pay

## 2013-09-09 ENCOUNTER — Ambulatory Visit (HOSPITAL_COMMUNITY)
Admission: RE | Admit: 2013-09-09 | Discharge: 2013-09-09 | Disposition: A | Discharge status: Home / Self Care | Payer: Medicare Other | Source: Ambulatory Visit | Attending: Internal Medicine | Admitting: Internal Medicine

## 2013-09-09 ENCOUNTER — Other Ambulatory Visit (HOSPITAL_COMMUNITY): Payer: Self-pay | Admitting: Internal Medicine

## 2013-09-09 DIAGNOSIS — R221 Localized swelling, mass and lump, neck: Secondary | ICD-10-CM

## 2013-09-09 DIAGNOSIS — R220 Localized swelling, mass and lump, head: Secondary | ICD-10-CM

## 2013-09-09 DIAGNOSIS — R599 Enlarged lymph nodes, unspecified: Secondary | ICD-10-CM | POA: Insufficient documentation

## 2013-09-09 DIAGNOSIS — R9389 Abnormal findings on diagnostic imaging of other specified body structures: Secondary | ICD-10-CM | POA: Insufficient documentation

## 2013-09-09 MED ORDER — IOHEXOL 300 MG/ML  SOLN
75.0000 mL | Freq: Once | INTRAMUSCULAR | Status: AC | PRN
  Administered 2013-09-09: 75 mL via INTRAVENOUS

## 2013-09-10 ENCOUNTER — Other Ambulatory Visit (HOSPITAL_COMMUNITY): Payer: Self-pay | Admitting: Internal Medicine

## 2013-09-10 DIAGNOSIS — I6529 Occlusion and stenosis of unspecified carotid artery: Secondary | ICD-10-CM

## 2013-09-10 DIAGNOSIS — I899 Noninfective disorder of lymphatic vessels and lymph nodes, unspecified: Secondary | ICD-10-CM

## 2013-09-10 DIAGNOSIS — I6523 Occlusion and stenosis of bilateral carotid arteries: Secondary | ICD-10-CM

## 2013-09-12 ENCOUNTER — Ambulatory Visit (HOSPITAL_COMMUNITY)
Admission: RE | Admit: 2013-09-12 | Discharge: 2013-09-12 | Disposition: A | Discharge status: Home / Self Care | Payer: Medicare Other | Source: Ambulatory Visit | Attending: Internal Medicine | Admitting: Internal Medicine

## 2013-09-12 ENCOUNTER — Other Ambulatory Visit (HOSPITAL_COMMUNITY): Payer: Self-pay | Admitting: Internal Medicine

## 2013-09-12 DIAGNOSIS — I6523 Occlusion and stenosis of bilateral carotid arteries: Secondary | ICD-10-CM

## 2013-09-12 DIAGNOSIS — I899 Noninfective disorder of lymphatic vessels and lymph nodes, unspecified: Secondary | ICD-10-CM

## 2013-09-12 DIAGNOSIS — I10 Essential (primary) hypertension: Secondary | ICD-10-CM | POA: Insufficient documentation

## 2013-09-12 DIAGNOSIS — I6529 Occlusion and stenosis of unspecified carotid artery: Secondary | ICD-10-CM | POA: Insufficient documentation

## 2013-09-12 DIAGNOSIS — E785 Hyperlipidemia, unspecified: Secondary | ICD-10-CM | POA: Insufficient documentation

## 2013-09-12 MED ORDER — IOHEXOL 300 MG/ML  SOLN
80.0000 mL | Freq: Once | INTRAMUSCULAR | Status: AC | PRN
  Administered 2013-09-12: 80 mL via INTRAVENOUS

## 2013-09-17 ENCOUNTER — Ambulatory Visit (HOSPITAL_COMMUNITY)
Admission: RE | Admit: 2013-09-17 | Discharge: 2013-09-17 | Disposition: A | Discharge status: Home / Self Care | Payer: Medicare Other | Source: Ambulatory Visit | Attending: Pulmonary Disease | Admitting: Pulmonary Disease

## 2013-09-17 DIAGNOSIS — R0602 Shortness of breath: Secondary | ICD-10-CM | POA: Insufficient documentation

## 2013-09-17 MED ORDER — ALBUTEROL SULFATE (2.5 MG/3ML) 0.083% IN NEBU
2.5000 mg | INHALATION_SOLUTION | Freq: Once | RESPIRATORY_TRACT | Status: AC
  Administered 2013-09-17: 2.5 mg via RESPIRATORY_TRACT

## 2013-09-18 LAB — PULMONARY FUNCTION TEST
DL/VA % PRED: 47 %
DL/VA: 2.14 ml/min/mmHg/L
DLCO cor % pred: 31 %
DLCO cor: 9.73 ml/min/mmHg
DLCO unc % pred: 31 %
DLCO unc: 9.73 ml/min/mmHg
FEF 25-75 POST: 3.11 L/s
FEF 25-75 Pre: 2.65 L/sec
FEF2575-%Change-Post: 17 %
FEF2575-%PRED-POST: 142 %
FEF2575-%Pred-Pre: 121 %
FEV1-%Change-Post: 2 %
FEV1-%PRED-PRE: 93 %
FEV1-%Pred-Post: 95 %
FEV1-POST: 2.86 L
FEV1-Pre: 2.78 L
FEV1FVC-%CHANGE-POST: 2 %
FEV1FVC-%Pred-Pre: 110 %
FEV6-%CHANGE-POST: 0 %
FEV6-%Pred-Post: 88 %
FEV6-%Pred-Pre: 89 %
FEV6-PRE: 3.44 L
FEV6-Post: 3.42 L
FEV6FVC-%Pred-Post: 106 %
FEV6FVC-%Pred-Pre: 106 %
FVC-%Change-Post: 0 %
FVC-%PRED-POST: 84 %
FVC-%Pred-Pre: 83 %
FVC-Post: 3.46 L
FVC-Pre: 3.44 L
POST FEV1/FVC RATIO: 83 %
POST FEV6/FVC RATIO: 100 %
PRE FEV1/FVC RATIO: 81 %
Pre FEV6/FVC Ratio: 100 %
RV % PRED: 65 %
RV: 1.63 L
TLC % pred: 72 %
TLC: 4.97 L

## 2013-09-24 ENCOUNTER — Other Ambulatory Visit (HOSPITAL_COMMUNITY): Payer: Self-pay | Admitting: Oncology

## 2013-09-24 DIAGNOSIS — J8410 Pulmonary fibrosis, unspecified: Secondary | ICD-10-CM

## 2013-09-24 DIAGNOSIS — R599 Enlarged lymph nodes, unspecified: Secondary | ICD-10-CM

## 2013-10-15 ENCOUNTER — Encounter: Payer: Medicare Other | Admitting: Vascular Surgery

## 2013-10-29 ENCOUNTER — Encounter: Payer: Medicare Other | Admitting: Vascular Surgery

## 2013-11-26 ENCOUNTER — Other Ambulatory Visit: Payer: Self-pay | Admitting: Internal Medicine

## 2013-11-26 NOTE — Telephone Encounter (Signed)
Rx was sent to pharmacy electronically. 

## 2013-12-06 ENCOUNTER — Telehealth (HOSPITAL_COMMUNITY): Payer: Self-pay

## 2013-12-06 NOTE — Telephone Encounter (Signed)
I have called and left a message with Russell Patton to inquire about participation in Pulmonary Rehab. Will send letter in mail and follow up.

## 2013-12-09 ENCOUNTER — Telehealth (HOSPITAL_COMMUNITY): Payer: Self-pay

## 2013-12-09 NOTE — Telephone Encounter (Signed)
Spoke with patient's wife regarding entrance to Pulmonary Rehab.  Patient's wife states that they live closer to [facility].  I will fax over the order to [facility].

## 2014-02-06 ENCOUNTER — Encounter (HOSPITAL_COMMUNITY): Payer: Medicare Other

## 2014-02-26 ENCOUNTER — Encounter (HOSPITAL_COMMUNITY): Payer: Self-pay | Admitting: Cardiovascular Disease

## 2014-02-27 ENCOUNTER — Encounter (HOSPITAL_COMMUNITY): Payer: Self-pay | Admitting: Internal Medicine

## 2014-03-24 ENCOUNTER — Ambulatory Visit (HOSPITAL_COMMUNITY): Payer: Medicare Other

## 2014-04-03 ENCOUNTER — Encounter (HOSPITAL_COMMUNITY): Payer: Self-pay | Admitting: Cardiothoracic Surgery

## 2014-04-14 ENCOUNTER — Ambulatory Visit: Payer: Medicare Other | Admitting: Internal Medicine

## 2014-04-14 ENCOUNTER — Telehealth: Payer: Self-pay | Admitting: Internal Medicine

## 2014-04-14 NOTE — Telephone Encounter (Signed)
Close encounter 

## 2014-04-20 DIAGNOSIS — J8410 Pulmonary fibrosis, unspecified: Secondary | ICD-10-CM | POA: Diagnosis not present

## 2014-04-20 DIAGNOSIS — R0902 Hypoxemia: Secondary | ICD-10-CM | POA: Diagnosis not present

## 2014-04-20 DIAGNOSIS — J439 Emphysema, unspecified: Secondary | ICD-10-CM | POA: Diagnosis not present

## 2014-04-22 DIAGNOSIS — J431 Panlobular emphysema: Secondary | ICD-10-CM | POA: Diagnosis not present

## 2014-04-22 DIAGNOSIS — J849 Interstitial pulmonary disease, unspecified: Secondary | ICD-10-CM | POA: Diagnosis not present

## 2014-05-19 DIAGNOSIS — R0902 Hypoxemia: Secondary | ICD-10-CM | POA: Diagnosis not present

## 2014-05-19 DIAGNOSIS — J8410 Pulmonary fibrosis, unspecified: Secondary | ICD-10-CM | POA: Diagnosis not present

## 2014-05-19 DIAGNOSIS — J439 Emphysema, unspecified: Secondary | ICD-10-CM | POA: Diagnosis not present

## 2014-05-29 ENCOUNTER — Ambulatory Visit (INDEPENDENT_AMBULATORY_CARE_PROVIDER_SITE_OTHER): Payer: Medicare Other | Admitting: Internal Medicine

## 2014-05-29 ENCOUNTER — Encounter: Payer: Self-pay | Admitting: Internal Medicine

## 2014-05-29 VITALS — BP 138/70 | HR 66 | Ht 69.0 in | Wt 209.9 lb

## 2014-05-29 DIAGNOSIS — E785 Hyperlipidemia, unspecified: Secondary | ICD-10-CM

## 2014-05-29 DIAGNOSIS — I10 Essential (primary) hypertension: Secondary | ICD-10-CM | POA: Diagnosis not present

## 2014-05-29 DIAGNOSIS — I2510 Atherosclerotic heart disease of native coronary artery without angina pectoris: Secondary | ICD-10-CM | POA: Diagnosis not present

## 2014-05-29 DIAGNOSIS — Z951 Presence of aortocoronary bypass graft: Secondary | ICD-10-CM | POA: Diagnosis not present

## 2014-05-29 DIAGNOSIS — J849 Interstitial pulmonary disease, unspecified: Secondary | ICD-10-CM

## 2014-05-29 MED ORDER — NITROGLYCERIN 0.4 MG SL SUBL: 0.4000 mg | SUBLINGUAL_TABLET | SUBLINGUAL | Status: AC | PRN

## 2014-05-29 NOTE — Progress Notes (Signed)
OFFICE NOTE  Chief Complaint:  Dyspnea, routine follow-up  Primary Care Physician: Glo Herring., MD  HPI:  Russell Patton is a 74 year old gentleman who was seen by my partner for complaints of bilateral chest tightness and shortness of breath. He has a history of coronary disease and had 4-vessel CABG in November of 2012. He had some symptoms of improvement in his shortness of breath which was the symptom he had after bypass surgery, and that symptom has actually gotten worse. He recently underwent a stress test in our office which demonstrated a reversible inferior defect and 4-5 mm of significant ST depression during exercise. A cardiac catheterization was recommended. He did undergo heart catheterization on June 14, 2012 by myself. This demonstrated multi-vessel coronary disease with severe distal left main stenosis and high grade proximal RCA stenosis. There was moderate mid RCA stenosis. The vein graft to the diagonal was occluded as well as the vein graft to OM2 and the PDA. Left ventricular end diastolic pressure was 6.   Based on these findings with all 3 vein grafts occluded and a patent LIMA to LAD we recommended intervention to the native coronary circulation. After reviewing the cath films with Dr. Claiborne Billings we recommended intervention to the proximal native RCA which had a 95% stenosis and a 60% unchanged mid vessel stenosis. He did undergo successful percutaneous intervention and stenting to the proximal RCA with a Xience Expedition drug-eluting stent post dilated about 3.75 mm. The patient tolerated the procedure well and subsequently had a marked improvement in his shortness of breath. There was questionable ischemia in the circumflex territory circulation, however, it was thought that this possibly could be back flowing from the LIMA and LAD system. Given the fact that there was not an abnormality on the nuclear stress test it was felt that we did not need to pursue  revascularization in that area unless the patient had worsening symptoms.   He returns today for six-month followup. He reports since switching from Brillinta to Plavix it is shortness of breath has improved.  Overall he seems to be doing pretty well. He is noted to have PACs and his EKG today. He says he is taking metoprolol tartrate only at night due to low blood pressure the morning when taking losartan.  I saw Russell Patton back in the office today. Unfortunately has developed progressive dyspnea and noted changes to his nails. In fact it was pointed out by his daughter who is a Marine scientist and she felt that he was developing clubbing. She then referred him to a pulmonologist in [place] who diagnosed him with interstitial lung disease. Ultimately was referred to [facility] and is currently undergoing treatment. He is on oxygen 24/7. He's also sought out approved stem cell therapy at [facility] in [place], [place]. From a cardiac standpoint, he does report some chest discomfort after marked exertion, particularly if he was to be without his oxygen. EKG today shows some mild ST segment abnormalities anterolaterally. He does not feel like any of the chest pain symptoms are similar to what he had had in the past.  PMHx:  Past Medical History  Diagnosis Date  . Hypertension   . Carotid artery stenosis   . Hyperlipidemia   . Coronary artery disease     CABG x4 2012  . 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 Arthritis   . Smoker     quit  . History of adenomatous polyp of colon 2005/2008  . Subclavian artery  stenosis, right   . Anginal pain   . Shortness of breath   . Chronic kidney disease     Past Surgical History  Procedure Laterality Date  . Appendectomy    . Cervical disc surgery    . Coronary artery bypass graft  01/29/2011    Procedure: CORONARY ARTERY BYPASS GRAFTING (CABG)TIMES FOUR;  Surgeon: Tharon Aquas Trigt III, MD;  Location: [facility];  Service: Open Heart Surgery;   Laterality: N/A;  using left internal mammary artery and endoscopically harvested right saphenous vein  . Colonoscopy  01/15/07    normal rectum/cecal/descending colon polyps. adenomatous polyp  . Colonoscopy  10/20/03    normal rectum/sigmoid diverticula, polyps at splenic flexure and at cecum, adenomatous polyp  . Colonoscopy  06/08/2011    Procedure: COLONOSCOPY;  Surgeon: Daneil Dolin, MD;  [facility];  Service: Endoscopy;  Laterality: N/A;  8:30  . Cystoscopy  1970's  . Left heart catheterization with coronary angiogram N/A 01/28/2011    Procedure: LEFT HEART CATHETERIZATION WITH CORONARY ANGIOGRAM;  Surgeon: Troy Sine, MD;  Location: [facility] CATH LAB;  Service: Cardiovascular;  Laterality: N/A;  . Abdominal angiogram N/A 01/28/2011    Procedure: ABDOMINAL ANGIOGRAM;  Surgeon: Troy Sine, MD;  Location: [facility] CATH LAB;  Service: Cardiovascular;  Laterality: N/A;  . Left heart catheterization with coronary angiogram N/A 06/14/2012    Procedure: LEFT HEART CATHETERIZATION WITH CORONARY ANGIOGRAM;  Surgeon: Pixie Casino, MD;  Location: [facility] CATH LAB;  Service: Cardiovascular;  Laterality: N/A;    FAMHx:  Family History  Problem Relation Age of Onset  . Cancer Father 69    colon  . Heart attack Father   . Dementia Mother     SOCHx:   reports that he quit smoking about 15 years ago. His smoking use included Cigarettes. He has never used smokeless tobacco. He reports that he drinks alcohol. He reports that he does not use illicit drugs.  ALLERGIES:  Allergies  Allergen Reactions  . Statins Other (See Comments)    Muscle aches     ROS: A comprehensive review of systems was negative except for: Respiratory: positive for dyspnea on exertion Cardiovascular: positive for chest pain  HOME MEDS: Current Outpatient Prescriptions  Medication Sig Dispense Refill  . clopidogrel (PLAVIX) 75 MG tablet Take 1 tablet by mouth  daily 90 tablet 2  . Glutathione CRYS Inhale into  the lungs every other day.    . metoprolol tartrate (LOPRESSOR) 25 MG tablet Take 1 tablet (25mg ) by mouth during day and 1/2 tablet (12.5mg ) by mouth at night.    . Multiple Vitamin (MULTIVITAMIN WITH MINERALS) TABS Take 1 tablet by mouth daily.    . nitroGLYCERIN (NITROSTAT) 0.4 MG SL tablet Place 1 tablet (0.4 mg total) under the tongue every 5 (five) minutes as needed for chest pain. 25 tablet 3  . NON FORMULARY Inject into the skin every 30 (thirty) days. Hepapocype    . Pirfenidone 267 MG CAPS 3 tablets 3 (three) times daily with meals.    Marland Kitchen ZETIA 10 MG tablet Take 1 tablet by mouth  every evening 90 tablet 2   No current facility-administered medications for this visit.    LABS/IMAGING: No results found for this or any previous visit (from the past 48 hour(s)). No results found.  VITALS: BP 138/70 mmHg  Pulse 66  Ht 5\' 9"  (1.753 m)  Wt 209 lb 14.4 oz (95.21 kg)  BMI 30.98 kg/m2  EXAM: General  appearance: alert, no distress and On oxygen by nasal cannula Neck: no carotid bruit and no JVD Lungs: rales bilaterally and Dry Heart: regular rate and rhythm Abdomen: soft, non-tender; bowel sounds normal; no masses,  no organomegaly Extremities: extremities normal, atraumatic, no cyanosis or edema Pulses: 2+ and symmetric Skin: Skin color, texture, turgor normal. No rashes or lesions Neurologic: Grossly normal Psych: Mood, affect normal  EKG: Normal sinus rhythm at 66  ASSESSMENT: 1. Coronary artery disease status post four-vessel CABG in 2012 (with only a patent LIMA to LAD by heart cath in 2014) 2. Status post PCI with drug-eluting stent to the right coronary artery in 2014 3. Mild dyspnea 4. Hypertension 5. Dyslipidemia 6. Obesity 7. Statin intolerant 8. Newly diagnosed interstitial lung disease  PLAN: 1.   Russell Patton has developed new progressive shortness of breath and was diagnosed with interstitial lung disease. Seems to be fairly aggressive as he is currently on  oxygen 24 7 and some new medications and is followed at [facility]. From a cardiac standpoint he is describing some chest discomfort with marked exertion, but not with normal activities. There is some borderline EKG changes. He does not feel that this is similar to his previous angina. I asked him to follow this very closely as if he were to have more changes, he may need repeat a coronary evaluation. We will go ahead and refill his nitroglycerin today as his prescription is old. I've encouraged him to use that if needed if his chest pain comes on and is not relieved by rest. Plan to see him back sooner in 6 months for close follow-up.  Pixie Casino, MD, [facility] Attending Cardiologist [facility] 05/29/2014, 8:36 AM

## 2014-05-29 NOTE — Patient Instructions (Signed)
Your physician wants you to follow-up in: 6 months with Dr. Hilty. You will receive a reminder letter in the mail two months in advance. If you don't receive a letter, please call our office to schedule the follow-up appointment.    

## 2014-06-19 DIAGNOSIS — J8410 Pulmonary fibrosis, unspecified: Secondary | ICD-10-CM | POA: Diagnosis not present

## 2014-06-19 DIAGNOSIS — R0902 Hypoxemia: Secondary | ICD-10-CM | POA: Diagnosis not present

## 2014-06-19 DIAGNOSIS — J439 Emphysema, unspecified: Secondary | ICD-10-CM | POA: Diagnosis not present

## 2014-07-19 DIAGNOSIS — J8410 Pulmonary fibrosis, unspecified: Secondary | ICD-10-CM | POA: Diagnosis not present

## 2014-07-19 DIAGNOSIS — R0902 Hypoxemia: Secondary | ICD-10-CM | POA: Diagnosis not present

## 2014-07-19 DIAGNOSIS — J439 Emphysema, unspecified: Secondary | ICD-10-CM | POA: Diagnosis not present

## 2014-07-21 DIAGNOSIS — J431 Panlobular emphysema: Secondary | ICD-10-CM | POA: Diagnosis not present

## 2014-07-21 DIAGNOSIS — J849 Interstitial pulmonary disease, unspecified: Secondary | ICD-10-CM | POA: Diagnosis not present

## 2014-07-21 DIAGNOSIS — J479 Bronchiectasis, uncomplicated: Secondary | ICD-10-CM | POA: Diagnosis not present

## 2014-07-21 DIAGNOSIS — R0902 Hypoxemia: Secondary | ICD-10-CM | POA: Diagnosis not present

## 2014-07-21 DIAGNOSIS — J984 Other disorders of lung: Secondary | ICD-10-CM | POA: Diagnosis not present

## 2014-07-21 DIAGNOSIS — J449 Chronic obstructive pulmonary disease, unspecified: Secondary | ICD-10-CM | POA: Diagnosis not present

## 2014-07-21 DIAGNOSIS — R911 Solitary pulmonary nodule: Secondary | ICD-10-CM | POA: Diagnosis not present

## 2014-07-21 DIAGNOSIS — R0602 Shortness of breath: Secondary | ICD-10-CM | POA: Diagnosis not present

## 2014-07-21 DIAGNOSIS — Z79899 Other long term (current) drug therapy: Secondary | ICD-10-CM | POA: Diagnosis not present

## 2014-08-04 ENCOUNTER — Telehealth (HOSPITAL_COMMUNITY): Payer: Self-pay

## 2014-08-04 NOTE — Telephone Encounter (Signed)
Patient lives much closer to [facility].  Patient is interested in the program.  Will fax order over to [facility] for review.

## 2014-08-19 DIAGNOSIS — J439 Emphysema, unspecified: Secondary | ICD-10-CM | POA: Diagnosis not present

## 2014-08-19 DIAGNOSIS — J8410 Pulmonary fibrosis, unspecified: Secondary | ICD-10-CM | POA: Diagnosis not present

## 2014-08-19 DIAGNOSIS — R0902 Hypoxemia: Secondary | ICD-10-CM | POA: Diagnosis not present

## 2014-09-05 ENCOUNTER — Other Ambulatory Visit: Payer: Self-pay | Admitting: Internal Medicine

## 2014-09-18 DIAGNOSIS — J8410 Pulmonary fibrosis, unspecified: Secondary | ICD-10-CM | POA: Diagnosis not present

## 2014-09-18 DIAGNOSIS — R0902 Hypoxemia: Secondary | ICD-10-CM | POA: Diagnosis not present

## 2014-09-18 DIAGNOSIS — J439 Emphysema, unspecified: Secondary | ICD-10-CM | POA: Diagnosis not present

## 2014-09-20 ENCOUNTER — Emergency Department (HOSPITAL_COMMUNITY)
Admission: EM | Admit: 2014-09-20 | Discharge: 2014-09-20 | Disposition: A | Discharge status: Home / Self Care | Payer: Medicare Other | Attending: Emergency Medicine | Admitting: Emergency Medicine

## 2014-09-20 ENCOUNTER — Emergency Department (HOSPITAL_COMMUNITY): Payer: Medicare Other

## 2014-09-20 ENCOUNTER — Encounter (HOSPITAL_COMMUNITY): Payer: Self-pay | Admitting: Emergency Medicine

## 2014-09-20 DIAGNOSIS — R0603 Acute respiratory distress: Secondary | ICD-10-CM

## 2014-09-20 DIAGNOSIS — N189 Chronic kidney disease, unspecified: Secondary | ICD-10-CM | POA: Diagnosis not present

## 2014-09-20 DIAGNOSIS — Z8739 Personal history of other diseases of the musculoskeletal system and connective tissue: Secondary | ICD-10-CM | POA: Insufficient documentation

## 2014-09-20 DIAGNOSIS — Z951 Presence of aortocoronary bypass graft: Secondary | ICD-10-CM | POA: Insufficient documentation

## 2014-09-20 DIAGNOSIS — Z87891 Personal history of nicotine dependence: Secondary | ICD-10-CM | POA: Diagnosis not present

## 2014-09-20 DIAGNOSIS — R0602 Shortness of breath: Secondary | ICD-10-CM | POA: Diagnosis not present

## 2014-09-20 DIAGNOSIS — J8410 Pulmonary fibrosis, unspecified: Secondary | ICD-10-CM | POA: Insufficient documentation

## 2014-09-20 DIAGNOSIS — R0600 Dyspnea, unspecified: Secondary | ICD-10-CM | POA: Insufficient documentation

## 2014-09-20 DIAGNOSIS — Z86010 Personal history of colonic polyps: Secondary | ICD-10-CM | POA: Diagnosis not present

## 2014-09-20 DIAGNOSIS — R569 Unspecified convulsions: Secondary | ICD-10-CM | POA: Diagnosis not present

## 2014-09-20 DIAGNOSIS — I2510 Atherosclerotic heart disease of native coronary artery without angina pectoris: Secondary | ICD-10-CM | POA: Diagnosis not present

## 2014-09-20 DIAGNOSIS — Z79899 Other long term (current) drug therapy: Secondary | ICD-10-CM | POA: Insufficient documentation

## 2014-09-20 DIAGNOSIS — I129 Hypertensive chronic kidney disease with stage 1 through stage 4 chronic kidney disease, or unspecified chronic kidney disease: Secondary | ICD-10-CM | POA: Diagnosis not present

## 2014-09-20 DIAGNOSIS — Z7902 Long term (current) use of antithrombotics/antiplatelets: Secondary | ICD-10-CM | POA: Diagnosis not present

## 2014-09-20 DIAGNOSIS — E785 Hyperlipidemia, unspecified: Secondary | ICD-10-CM | POA: Diagnosis not present

## 2014-09-20 DIAGNOSIS — R0682 Tachypnea, not elsewhere classified: Secondary | ICD-10-CM | POA: Diagnosis not present

## 2014-09-20 DIAGNOSIS — Z9889 Other specified postprocedural states: Secondary | ICD-10-CM | POA: Diagnosis not present

## 2014-09-20 DIAGNOSIS — R55 Syncope and collapse: Secondary | ICD-10-CM | POA: Insufficient documentation

## 2014-09-20 HISTORY — DX: Pulmonary fibrosis, unspecified: J84.10

## 2014-09-20 LAB — CBC WITH DIFFERENTIAL/PLATELET
BASOS PCT: 0 % (ref 0–1)
Basophils Absolute: 0 10*3/uL (ref 0.0–0.1)
Eosinophils Absolute: 0.3 10*3/uL (ref 0.0–0.7)
Eosinophils Relative: 3 % (ref 0–5)
HEMATOCRIT: 53.8 % — AB (ref 39.0–52.0)
HEMOGLOBIN: 18.3 g/dL — AB (ref 13.0–17.0)
LYMPHS ABS: 3.2 10*3/uL (ref 0.7–4.0)
LYMPHS PCT: 33 % (ref 12–46)
MCH: 31.8 pg (ref 26.0–34.0)
MCHC: 34 g/dL (ref 30.0–36.0)
MCV: 93.4 fL (ref 78.0–100.0)
MONOS PCT: 8 % (ref 3–12)
Monocytes Absolute: 0.8 10*3/uL (ref 0.1–1.0)
NEUTROS PCT: 56 % (ref 43–77)
Neutro Abs: 5.5 10*3/uL (ref 1.7–7.7)
PLATELETS: 175 10*3/uL (ref 150–400)
RBC: 5.76 MIL/uL (ref 4.22–5.81)
RDW: 13.8 % (ref 11.5–15.5)
WBC: 9.9 10*3/uL (ref 4.0–10.5)

## 2014-09-20 LAB — BASIC METABOLIC PANEL
Anion gap: 9 (ref 5–15)
BUN: 16 mg/dL (ref 6–20)
CHLORIDE: 107 mmol/L (ref 101–111)
CO2: 20 mmol/L — ABNORMAL LOW (ref 22–32)
Calcium: 8.4 mg/dL — ABNORMAL LOW (ref 8.9–10.3)
Creatinine, Ser: 1.09 mg/dL (ref 0.61–1.24)
GFR calc Af Amer: >60 mL/min (ref >60)
GFR calc non Af Amer: >60 mL/min (ref >60)
Glucose, Bld: 196 mg/dL — ABNORMAL HIGH (ref 65–99)
Potassium: 3.9 mmol/L (ref 3.5–5.1)
SODIUM: 136 mmol/L (ref 135–145)

## 2014-09-20 LAB — I-STAT TROPONIN, ED: TROPONIN I, POC: 0 ng/mL (ref 0.00–0.08)

## 2014-09-20 NOTE — ED Provider Notes (Addendum)
CSN: [id number]     Arrival date & time 09/20/14  1884 History   First MD Initiated Contact with Patient 09/20/14 [phone]     Chief Complaint  Patient presents with  . Loss of Consciousness     (Consider location/radiation/quality/duration/timing/severity/associated sxs/prior Treatment) HPI   Russell Patton is a 74 y.o. male who presents for evaluation of respiratory distress, and seizure. Patient was in his car, with a running, in [place], when he suddenly began shaking and lost continence of urine and stool. The shaking was described as extending both legs, and some movements of his bilateral hands and lower arms. The wife does not describe generalized tonic-clonic activity. The patient states he recalls EMS, getting there  while he was in his car. His wife agrees that he was awake when EMS arrived. They reportedly arrived within 5-10 minutes of the shaking spell. EMS arrived and found him to be cyanotic. He was alert at that time. His oxygenation improved, evidenced by decreased cyanosis, with facemask oxygen. EMS did not describe him as being postictal. On arrival in ED patient is alert, and complains of shortness of breath. The patient states that he recalls feeling like he was not getting enough oxygen prior to the episode. 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 He tried to adjust the oxygen device, but feels like it was not working. He denies headache, neck pain, back pain, chest pain, nausea or vomiting at this time. He received Zofran during transport. CBG was normal during transport. His wife is with him and states that 3 weeks ago he had an episode of falling with head injury, and was unable to get up for "one hour". The patient was alone at that time. The wife found out about the episode when he walked back into the house.   Past Medical History  Diagnosis Date  . Hypertension   . Carotid artery stenosis   . Hyperlipidemia   . Coronary artery disease      CABG x4 2012  . 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 Arthritis   . Smoker     quit  . History of adenomatous polyp of colon 2005/2008  . Subclavian artery stenosis, right   . Anginal pain   . Shortness of breath   . Chronic kidney disease   . Pulmonary fibrosis    Past Surgical History  Procedure Laterality Date  . Appendectomy    . Cervical disc surgery    . Coronary artery bypass graft  01/29/2011    Procedure: CORONARY ARTERY BYPASS GRAFTING (CABG)TIMES FOUR;  Surgeon: Tharon Aquas Trigt III, MD;  Location: [facility];  Service: Open Heart Surgery;  Laterality: N/A;  using left internal mammary artery and endoscopically harvested right saphenous vein  . Colonoscopy  01/15/07    normal rectum/cecal/descending colon polyps. adenomatous polyp  . Colonoscopy  10/20/03    normal rectum/sigmoid diverticula, polyps at splenic flexure and at cecum, adenomatous polyp  . Colonoscopy  06/08/2011    Procedure: COLONOSCOPY;  Surgeon: Daneil Dolin, MD;  [facility];  Service: Endoscopy;  Laterality: N/A;  8:30  . Cystoscopy  1970's  . Left heart catheterization with coronary angiogram N/A 01/28/2011    Procedure: LEFT HEART CATHETERIZATION WITH CORONARY ANGIOGRAM;  Surgeon: Troy Sine, MD;  Location: [facility] CATH LAB;  Service: Cardiovascular;  Laterality: N/A;  . Abdominal angiogram N/A 01/28/2011    Procedure: ABDOMINAL ANGIOGRAM;  Surgeon: Troy Sine, MD;  Location:  [facility] CATH LAB;  Service: Cardiovascular;  Laterality: N/A;  . Left heart catheterization with coronary angiogram N/A 06/14/2012    Procedure: LEFT HEART CATHETERIZATION WITH CORONARY ANGIOGRAM;  Surgeon: Pixie Casino, MD;  Location: [facility] CATH LAB;  Service: Cardiovascular;  Laterality: N/A;   Family History  Problem Relation Age of Onset  . Cancer Father 32    colon  . Heart attack Father   . Dementia Mother    History  Substance Use Topics  . Smoking status: Former Smoker    Types: Cigarettes    Quit date:  03/22/1999  . Smokeless tobacco: Never Used  . Alcohol Use: 0.0 oz/week    0 Cans of beer per week     Comment: a beer once every 2-3 weeks, glass of wine 2-3/month    Review of Systems  All other systems reviewed and are negative.     Allergies  Statins  Home Medications   Prior to Admission medications   Medication Sig Start Date End Date Taking? Authorizing Provider  clopidogrel (PLAVIX) 75 MG tablet Take 1 tablet by mouth  daily 09/05/14  Yes Pixie Casino, MD  Glutathione CRYS Inhale 15 mLs into the lungs every other day.    Yes Historical Provider, MD  metoprolol tartrate (LOPRESSOR) 25 MG tablet Take 1 tablet (25mg ) by mouth during day and 1/2 tablet (12.5mg ) by mouth at night.   Yes Historical Provider, MD  Multiple Vitamin (MULTIVITAMIN WITH MINERALS) TABS Take 1 tablet by mouth daily.   Yes Historical Provider, MD  nitroGLYCERIN (NITROSTAT) 0.4 MG SL tablet Place 1 tablet (0.4 mg total) under the tongue every 5 (five) minutes as needed for chest pain. 05/29/14  Yes Pixie Casino, MD  Pirfenidone (ESBRIET) 267 MG CAPS Take 3 capsules by mouth 3 (three) times daily. 07/02/14  Yes Historical Provider, MD  ZETIA 10 MG tablet Take 1 tablet by mouth  every evening 09/05/14  Yes Pixie Casino, MD   BP 103/62 mmHg  Pulse 60  Temp(Src) 97.7 F (36.5 C) (Oral)  Resp 15  Ht 5\' 8"  (1.727 m)  Wt 200 lb (90.719 kg)  BMI 30.42 kg/m2  SpO2 96% Physical Exam  Constitutional: He is oriented to person, place, and time. He appears well-developed.  Elderly, frail  HENT:  Head: Normocephalic and atraumatic.  Right Ear: External ear normal.  Left Ear: External ear normal.  Mouth moist, no lingual laceration or abrasion.  Eyes: Conjunctivae and EOM are normal. Pupils are equal, round, and reactive to light.  Neck: Normal range of motion and phonation normal. Neck supple.  Cardiovascular: Normal rate, regular rhythm and normal heart sounds.   Pulmonary/Chest: Effort normal and  breath sounds normal. No respiratory distress. He has no wheezes. He exhibits no bony tenderness.  Abdominal: Soft. There is no tenderness.  Musculoskeletal: Normal range of motion.  Neurological: He is alert and oriented to person, place, and time. No cranial nerve deficit or sensory deficit. He exhibits normal muscle tone. Coordination normal.  Skin: Skin is warm, dry and intact.  Psychiatric: He has a normal mood and affect. His behavior is normal.  Nursing note and vitals reviewed.   ED Course  Procedures (including critical care time)  Delay encountered to get CT scan because of machine malfunction, initially. The machine began working at 11:25 AM.    Medications - No data to display  Patient Vitals for the past 24 hrs:  BP Temp Temp src Pulse Resp SpO2 Height Weight  09/20/14 1100 103/62 mmHg - - 60 15 96 % - -  09/20/14 1030 95/65 mmHg - - (!) 56 19 (!) 89 % - -  09/20/14 1000 107/75 mmHg - - (!) 55 19 91 % - -  09/20/14 0939 119/82 mmHg 97.7 F (36.5 C) Oral 68 18 92 % 5\' 8"  (1.727 m) 200 lb (90.719 kg)    11:25 AM Reevaluation with update and discussion. After initial assessment and treatment, an updated evaluation reveals he remains alert, calm and cooperative. No episodes of hypoxia or seizure in the ED. Patient and wife updated on findings. Ioanna Colquhoun L   11:30 a.m.-  I discussed the case with Dr. Hilma Favors, the patient's attending on call. He agrees with discharge and follow-up in office for further evaluation if the head CT is negative.   CRITICAL CARE Performed by: Richarda Blade Total critical care time: 35 minutes Critical care time was exclusive of separately billable procedures and treating other patients. Critical care was necessary to treat or prevent imminent or life-threatening deterioration. Critical care was time spent personally by me on the following activities: development of treatment plan with patient and/or surrogate as well as nursing, discussions  with consultants, evaluation of patient's response to treatment, examination of patient, obtaining history from patient or surrogate, ordering and performing treatments and interventions, ordering and review of laboratory studies, ordering and review of radiographic studies, pulse oximetry and re-evaluation of patient's condition.  Labs Review Labs Reviewed  BASIC METABOLIC PANEL - Abnormal; Notable for the following:    CO2 20 (*)    Glucose, Bld 196 (*)    Calcium 8.4 (*)    All other components within normal limits  CBC WITH DIFFERENTIAL/PLATELET - Abnormal; Notable for the following:    Hemoglobin 18.3 (*)    HCT 53.8 (*)    All other components within normal limits  Randolm Idol, ED    Imaging Review Dg Chest Port 1 View  09/20/2014   CLINICAL DATA:  Chronic shortness of breath. Hypoxemia. Current history of pulmonary fibrosis and COPD. Current smoker. Prior CABG.  EXAM: PORTABLE CHEST - 1 VIEW  COMPARISON:  CT chest 09/12/2013. Chest x-rays 06/12/2013 and earlier.  FINDINGS: Prior sternotomy for CABG. Cardiac silhouette normal in size for AP technique. Low lung volumes with progressive interstitial disease involving the mid and lower lungs. No confluent airspace consolidation. Pulmonary vascularity normal without evidence of pulmonary edema.  IMPRESSION: Chronic interstitial lung disease with low lung volumes consistent with pulmonary fibrosis, progressive since the prior examinations in 2015. No acute cardiopulmonary disease suspected.   Electronically Signed   By: Evangeline Dakin M.D.   On: 09/20/2014 10:13     EKG Interpretation   Date/Time:  Saturday September 20 2014 09:37:57 EDT Ventricular Rate:  66 PR Interval:  139 QRS Duration: 117 QT Interval:  446 QTC Calculation: 467 R Axis:   -105 Text Interpretation:  Sinus rhythm Incomplete RBBB and LAFB Nonspecific T  abnormalities, lateral leads Since last tracing Confirmed by Tayvion Lauder  MD,  Nezzie Manera ([id number]) on 09/20/2014 9:41:27  AM      MDM   Final diagnoses:  Respiratory distress  Seizure    Episode of decreased responsiveness with shaking, nonspecific. Patient has never had a seizure previously. He presents with normal neurologic exam. No other acute illnesses. There is history for malfunction of oxygen device, which could have preceded and therefore possibly caused the episode of decreased responsiveness. I think that it is unlikely that this is an epileptic  seizure. The patient's portable oxygen device will need to be evaluated for functionality, prior to using it again. There is no evidence for serious bacterial infection, metabolic instability or impending vascular collapse.   Nursing Notes Reviewed/ Care Coordinated Applicable Imaging Reviewed Interpretation of Laboratory Data incorporated into ED treatment  The patient appears reasonably screened and/or stabilized for discharge and I doubt any other medical condition or other [facility] requiring further screening, evaluation, or treatment in the ED at this time prior to discharge.  Plan: Home Medications- usual; Home Treatments- rest, stay on tank O2 at all times; return here if the recommended treatment, does not improve the symptoms; Recommended follow up- PCP asap to be evaluated for a seizure problem   Daleen Bo, MD 09/20/14 1212  Daleen Bo, MD  Note: Prior to D/C, Pt. Placed on O2 using his portable O2 generator and Sats repidly dropped. It appears that his device is non-functional and may have caused the episode of shaking, decreased O2 sat., and decreased responsiveness. Pt. Will be d/ced with a portable O2 tank, to use until he can get his device repaired/replaced. 09/20/14 [place], MD 10/05/14 1452

## 2014-09-20 NOTE — Discharge Instructions (Signed)
Do not use your portable oxygen machine, until it gets checked. Call your Dr. for a follow-up appointment as soon as possible. Return here if needed for problems.  Shortness of Breath Shortness of breath means you have trouble breathing. It could also mean that you have a medical problem. You should get immediate medical care for shortness of breath. CAUSES   Not enough oxygen in the air such as with high altitudes or a smoke-filled room.  Certain lung diseases, infections, or problems.  Heart disease or conditions, such as angina or heart failure.  Low red blood cells (anemia).  Poor physical fitness, which can cause shortness of breath when you exercise.  Chest or back injuries or stiffness.  Being overweight.  Smoking.  Anxiety, which can make you feel like you are not getting enough air. DIAGNOSIS  Serious medical problems can often be found during your physical exam. Tests may also be done to determine why you are having shortness of breath. Tests may include:  Chest X-rays.  Lung function tests.  Blood tests.  An electrocardiogram (ECG).  An ambulatory electrocardiogram. An ambulatory ECG records your heartbeat patterns over a 24-hour period.  Exercise testing.  A transthoracic echocardiogram (TTE). During echocardiography, sound waves are used to evaluate how blood flows through your heart.  A transesophageal echocardiogram (TEE).  Imaging scans. Your health care provider may not be able to find a cause for your shortness of breath after your exam. In this case, it is important to have a follow-up exam with your health care provider as directed.  TREATMENT  Treatment for shortness of breath depends on the cause of your symptoms and can vary greatly. HOME CARE INSTRUCTIONS   Do not smoke. Smoking is a common cause of shortness of breath. If you smoke, ask for help to quit.  Avoid being around chemicals or things that may bother your breathing, such as paint  fumes and dust.  Rest as needed. Slowly resume your usual activities.  If medicines were prescribed, take them as directed for the full length of time directed. This includes oxygen and any inhaled medicines.  Keep all follow-up appointments as directed by your health care provider. SEEK MEDICAL CARE IF:   Your condition does not improve in the time expected.  You have a hard time doing your normal activities even with rest.  You have any new symptoms. SEEK IMMEDIATE MEDICAL CARE IF:   Your shortness of breath gets worse.  You feel light-headed, faint, or develop a cough not controlled with medicines.  You start coughing up blood.  You have pain with breathing.  You have chest pain or pain in your arms, shoulders, or abdomen.  You have a fever.  You are unable to walk up stairs or exercise the way you normally do. MAKE SURE YOU:  Understand these instructions.  Will watch your condition.  Will get help right away if you are not doing well or get worse. Document Released: 11/30/2000 Document Revised: 03/12/2013 Document Reviewed: 05/23/2011 [facility] Patient Information 2015 [place], [place]. This information is not intended to replace advice given to you by your health care provider. Make sure you discuss any questions you have with your health care provider.

## 2014-09-20 NOTE — ED Notes (Addendum)
Per EMS, pt found low o2 saturation,syncope,urinary/bowel incontinence prior to EMS arrival. Pt alert and talking at this time. EDP at bedside.CBG en route 204.Zofran 4mg  admin en route.

## 2014-09-20 NOTE — ED Notes (Signed)
Pt states that his normal O2 sat is around 88-92%

## 2014-09-20 NOTE — ED Notes (Signed)
Pt denies any complaints at this time

## 2014-10-19 DIAGNOSIS — R0902 Hypoxemia: Secondary | ICD-10-CM | POA: Diagnosis not present

## 2014-10-19 DIAGNOSIS — J8410 Pulmonary fibrosis, unspecified: Secondary | ICD-10-CM | POA: Diagnosis not present

## 2014-10-19 DIAGNOSIS — J439 Emphysema, unspecified: Secondary | ICD-10-CM | POA: Diagnosis not present

## 2014-10-23 DIAGNOSIS — J9611 Chronic respiratory failure with hypoxia: Secondary | ICD-10-CM | POA: Diagnosis not present

## 2014-10-23 DIAGNOSIS — J849 Interstitial pulmonary disease, unspecified: Secondary | ICD-10-CM | POA: Diagnosis not present

## 2014-10-23 DIAGNOSIS — J449 Chronic obstructive pulmonary disease, unspecified: Secondary | ICD-10-CM | POA: Diagnosis not present

## 2014-10-23 DIAGNOSIS — J431 Panlobular emphysema: Secondary | ICD-10-CM | POA: Diagnosis not present

## 2014-11-19 DIAGNOSIS — J439 Emphysema, unspecified: Secondary | ICD-10-CM | POA: Diagnosis not present

## 2014-11-19 DIAGNOSIS — R0902 Hypoxemia: Secondary | ICD-10-CM | POA: Diagnosis not present

## 2014-11-19 DIAGNOSIS — J8410 Pulmonary fibrosis, unspecified: Secondary | ICD-10-CM | POA: Diagnosis not present

## 2014-12-01 ENCOUNTER — Other Ambulatory Visit: Payer: Self-pay | Admitting: Internal Medicine

## 2014-12-01 NOTE — Telephone Encounter (Signed)
Rx has been sent to the pharmacy electronically. ° °

## 2014-12-19 DIAGNOSIS — J439 Emphysema, unspecified: Secondary | ICD-10-CM | POA: Diagnosis not present

## 2014-12-19 DIAGNOSIS — R0902 Hypoxemia: Secondary | ICD-10-CM | POA: Diagnosis not present

## 2014-12-19 DIAGNOSIS — J8410 Pulmonary fibrosis, unspecified: Secondary | ICD-10-CM | POA: Diagnosis not present

## 2014-12-22 DIAGNOSIS — J8410 Pulmonary fibrosis, unspecified: Secondary | ICD-10-CM | POA: Diagnosis not present

## 2014-12-22 DIAGNOSIS — J9610 Chronic respiratory failure, unspecified whether with hypoxia or hypercapnia: Secondary | ICD-10-CM | POA: Diagnosis not present

## 2014-12-22 DIAGNOSIS — Z1389 Encounter for screening for other disorder: Secondary | ICD-10-CM | POA: Diagnosis not present

## 2014-12-22 DIAGNOSIS — Z6831 Body mass index (BMI) 31.0-31.9, adult: Secondary | ICD-10-CM | POA: Diagnosis not present

## 2014-12-22 DIAGNOSIS — K219 Gastro-esophageal reflux disease without esophagitis: Secondary | ICD-10-CM | POA: Diagnosis not present

## 2014-12-22 DIAGNOSIS — Z23 Encounter for immunization: Secondary | ICD-10-CM | POA: Diagnosis not present

## 2014-12-22 DIAGNOSIS — R0902 Hypoxemia: Secondary | ICD-10-CM | POA: Diagnosis not present

## 2015-01-19 DIAGNOSIS — J439 Emphysema, unspecified: Secondary | ICD-10-CM | POA: Diagnosis not present

## 2015-01-19 DIAGNOSIS — J8410 Pulmonary fibrosis, unspecified: Secondary | ICD-10-CM | POA: Diagnosis not present

## 2015-01-19 DIAGNOSIS — R0902 Hypoxemia: Secondary | ICD-10-CM | POA: Diagnosis not present

## 2015-02-03 DIAGNOSIS — J431 Panlobular emphysema: Secondary | ICD-10-CM | POA: Diagnosis not present

## 2015-02-03 DIAGNOSIS — J849 Interstitial pulmonary disease, unspecified: Secondary | ICD-10-CM | POA: Diagnosis not present

## 2015-02-03 DIAGNOSIS — J9611 Chronic respiratory failure with hypoxia: Secondary | ICD-10-CM | POA: Diagnosis not present

## 2015-02-18 DIAGNOSIS — J8410 Pulmonary fibrosis, unspecified: Secondary | ICD-10-CM | POA: Diagnosis not present

## 2015-02-18 DIAGNOSIS — R0902 Hypoxemia: Secondary | ICD-10-CM | POA: Diagnosis not present

## 2015-02-18 DIAGNOSIS — J439 Emphysema, unspecified: Secondary | ICD-10-CM | POA: Diagnosis not present

## 2015-02-26 DIAGNOSIS — J849 Interstitial pulmonary disease, unspecified: Secondary | ICD-10-CM | POA: Diagnosis not present

## 2015-02-26 DIAGNOSIS — J431 Panlobular emphysema: Secondary | ICD-10-CM | POA: Diagnosis not present

## 2015-02-26 DIAGNOSIS — R0902 Hypoxemia: Secondary | ICD-10-CM | POA: Diagnosis not present

## 2015-02-26 DIAGNOSIS — R0602 Shortness of breath: Secondary | ICD-10-CM | POA: Diagnosis not present

## 2015-03-21 DIAGNOSIS — J8410 Pulmonary fibrosis, unspecified: Secondary | ICD-10-CM | POA: Diagnosis not present

## 2015-03-21 DIAGNOSIS — R0902 Hypoxemia: Secondary | ICD-10-CM | POA: Diagnosis not present

## 2015-03-21 DIAGNOSIS — J439 Emphysema, unspecified: Secondary | ICD-10-CM | POA: Diagnosis not present

## 2015-04-08 ENCOUNTER — Inpatient Hospital Stay (HOSPITAL_COMMUNITY)
Admission: EM | Admit: 2015-04-08 | Discharge: 2015-04-22 | DRG: 298 | Disposition: E | Payer: Medicare Other | Attending: Emergency Medicine | Admitting: Emergency Medicine

## 2015-04-08 ENCOUNTER — Encounter (HOSPITAL_COMMUNITY): Payer: Self-pay | Admitting: Emergency Medicine

## 2015-04-08 ENCOUNTER — Emergency Department (HOSPITAL_COMMUNITY): Payer: Medicare Other

## 2015-04-08 DIAGNOSIS — Z66 Do not resuscitate: Secondary | ICD-10-CM | POA: Diagnosis not present

## 2015-04-08 DIAGNOSIS — I469 Cardiac arrest, cause unspecified: Principal | ICD-10-CM | POA: Diagnosis present

## 2015-04-08 DIAGNOSIS — R402421 Glasgow coma scale score 9-12, in the field [EMT or ambulance]: Secondary | ICD-10-CM | POA: Diagnosis not present

## 2015-04-08 DIAGNOSIS — R0600 Dyspnea, unspecified: Secondary | ICD-10-CM | POA: Diagnosis not present

## 2015-04-08 DIAGNOSIS — Z87891 Personal history of nicotine dependence: Secondary | ICD-10-CM

## 2015-04-08 DIAGNOSIS — Z4682 Encounter for fitting and adjustment of non-vascular catheter: Secondary | ICD-10-CM | POA: Diagnosis not present

## 2015-04-08 DIAGNOSIS — N189 Chronic kidney disease, unspecified: Secondary | ICD-10-CM | POA: Diagnosis present

## 2015-04-08 DIAGNOSIS — Z951 Presence of aortocoronary bypass graft: Secondary | ICD-10-CM | POA: Diagnosis not present

## 2015-04-08 DIAGNOSIS — E785 Hyperlipidemia, unspecified: Secondary | ICD-10-CM | POA: Diagnosis present

## 2015-04-08 DIAGNOSIS — I129 Hypertensive chronic kidney disease with stage 1 through stage 4 chronic kidney disease, or unspecified chronic kidney disease: Secondary | ICD-10-CM | POA: Diagnosis present

## 2015-04-08 DIAGNOSIS — I2510 Atherosclerotic heart disease of native coronary artery without angina pectoris: Secondary | ICD-10-CM | POA: Diagnosis present

## 2015-04-08 DIAGNOSIS — R001 Bradycardia, unspecified: Secondary | ICD-10-CM | POA: Diagnosis not present

## 2015-04-08 LAB — COMPREHENSIVE METABOLIC PANEL
ALK PHOS: 128 U/L — AB (ref 38–126)
ALT: 40 U/L (ref 17–63)
AST: 65 U/L — AB (ref 15–41)
Albumin: 2.8 g/dL — ABNORMAL LOW (ref 3.5–5.0)
Anion gap: 17 — ABNORMAL HIGH (ref 5–15)
BUN: 19 mg/dL (ref 6–20)
CALCIUM: 7.8 mg/dL — AB (ref 8.9–10.3)
CO2: 16 mmol/L — ABNORMAL LOW (ref 22–32)
CREATININE: 1.37 mg/dL — AB (ref 0.61–1.24)
Chloride: 107 mmol/L (ref 101–111)
GFR calc Af Amer: 57 mL/min — ABNORMAL LOW (ref >60)
GFR calc non Af Amer: 49 mL/min — ABNORMAL LOW (ref >60)
Glucose, Bld: 246 mg/dL — ABNORMAL HIGH (ref 65–99)
Potassium: 3.8 mmol/L (ref 3.5–5.1)
SODIUM: 140 mmol/L (ref 135–145)
Total Bilirubin: 1 mg/dL (ref 0.3–1.2)
Total Protein: 5.9 g/dL — ABNORMAL LOW (ref 6.5–8.1)

## 2015-04-08 LAB — CBC WITH DIFFERENTIAL/PLATELET
BASOS ABS: 0.1 10*3/uL (ref 0.0–0.1)
Basophils Relative: 1 %
Eosinophils Absolute: 0.3 10*3/uL (ref 0.0–0.7)
Eosinophils Relative: 2 %
HCT: 54 % — ABNORMAL HIGH (ref 39.0–52.0)
Hemoglobin: 17.2 g/dL — ABNORMAL HIGH (ref 13.0–17.0)
LYMPHS PCT: 45 %
Lymphs Abs: 6 10*3/uL — ABNORMAL HIGH (ref 0.7–4.0)
MCH: 32.6 pg (ref 26.0–34.0)
MCHC: 31.9 g/dL (ref 30.0–36.0)
MCV: 102.3 fL — ABNORMAL HIGH (ref 78.0–100.0)
Monocytes Absolute: 0.7 10*3/uL (ref 0.1–1.0)
Monocytes Relative: 5 %
NEUTROS ABS: 6.1 10*3/uL (ref 1.7–7.7)
Neutrophils Relative %: 47 %
Platelets: 81 10*3/uL — ABNORMAL LOW (ref 150–400)
RBC: 5.28 MIL/uL (ref 4.22–5.81)
RDW: 15.1 % (ref 11.5–15.5)
SMEAR REVIEW: DECREASED
WBC: 13.1 10*3/uL — ABNORMAL HIGH (ref 4.0–10.5)

## 2015-04-08 LAB — BLOOD GAS, ARTERIAL
Acid-base deficit: 17.6 mmol/L — ABNORMAL HIGH (ref 0.0–2.0)
BICARBONATE: 9.3 meq/L — AB (ref 20.0–24.0)
Drawn by: 382351
FIO2: 1
LHR: 20 {breaths}/min
MECHVT: 550 mL
O2 Saturation: 95.7 %
PEEP: 8 cmH2O
PO2 ART: 162 mmHg — AB (ref 80.0–100.0)
Patient temperature: 37
pCO2 arterial: 65.9 mmHg (ref 35.0–45.0)
pH, Arterial: 6.918 — CL (ref 7.350–7.450)

## 2015-04-08 LAB — I-STAT CG4 LACTIC ACID, ED: LACTIC ACID, VENOUS: 12.54 mmol/L — AB (ref 0.5–2.0)

## 2015-04-08 LAB — I-STAT CHEM 8, ED
BUN: 20 mg/dL (ref 6–20)
CHLORIDE: 109 mmol/L (ref 101–111)
Calcium, Ion: 1.04 mmol/L — ABNORMAL LOW (ref 1.13–1.30)
Creatinine, Ser: 1 mg/dL (ref 0.61–1.24)
GLUCOSE: 234 mg/dL — AB (ref 65–99)
HCT: 56 % — ABNORMAL HIGH (ref 39.0–52.0)
Hemoglobin: 19 g/dL — ABNORMAL HIGH (ref 13.0–17.0)
Potassium: 3.8 mmol/L (ref 3.5–5.1)
Sodium: 141 mmol/L (ref 135–145)
TCO2: 15 mmol/L (ref 0–100)

## 2015-04-08 LAB — I-STAT TROPONIN, ED: Troponin i, poc: 0.04 ng/mL (ref 0.00–0.08)

## 2015-04-08 LAB — BRAIN NATRIURETIC PEPTIDE: B Natriuretic Peptide: 1027 pg/mL — ABNORMAL HIGH (ref 0.0–100.0)

## 2015-04-08 MED ORDER — SODIUM CHLORIDE 0.9 % IV BOLUS (SEPSIS)
1000.0000 mL | Freq: Once | INTRAVENOUS | Status: AC
  Administered 2015-04-08: 1000 mL via INTRAVENOUS

## 2015-04-08 MED ORDER — VANCOMYCIN HCL IN DEXTROSE 1-5 GM/200ML-% IV SOLN
1000.0000 mg | Freq: Once | INTRAVENOUS | Status: AC
  Administered 2015-04-08: 1000 mg via INTRAVENOUS
  Filled 2015-04-08: qty 200

## 2015-04-08 MED ORDER — PROPOFOL 1000 MG/100ML IV EMUL
INTRAVENOUS | Status: AC
  Filled 2015-04-08: qty 100

## 2015-04-08 MED ORDER — SODIUM BICARBONATE 8.4 % IV SOLN
50.0000 meq | Freq: Once | INTRAVENOUS | Status: AC
  Administered 2015-04-08: 50 meq via INTRAVENOUS
  Filled 2015-04-08: qty 50

## 2015-04-08 MED ORDER — MIDAZOLAM HCL 5 MG/5ML IJ SOLN
INTRAMUSCULAR | Status: AC
  Filled 2015-04-08: qty 5

## 2015-04-08 MED ORDER — PIPERACILLIN-TAZOBACTAM 3.375 G IVPB 30 MIN
3.3750 g | Freq: Once | INTRAVENOUS | Status: AC
  Administered 2015-04-08: 3.375 g via INTRAVENOUS
  Filled 2015-04-08: qty 50

## 2015-04-08 MED ORDER — PROPOFOL 1000 MG/100ML IV EMUL
5.0000 ug/kg/min | INTRAVENOUS | Status: DC
  Administered 2015-04-08: 5 ug/kg/min via INTRAVENOUS

## 2015-04-08 MED ORDER — MIDAZOLAM HCL 5 MG/5ML IJ SOLN
5.0000 mg | Freq: Once | INTRAMUSCULAR | Status: AC
Start: 2015-04-08 — End: 2015-04-08
  Administered 2015-04-08: 5 mg via INTRAVENOUS

## 2015-04-08 NOTE — ED Notes (Signed)
Patient has palpable pulse of 73.

## 2015-04-08 NOTE — ED Notes (Addendum)
Patient from home brought in with CPR in progress. Per EMS, patient collapsed at home, was put on non-rebreather with no relief. States patient went unresponsive and CPR initiated at approximately 2045. States patient was given epi x 4 prior to arrival to ED. Correction from EMS, patient received epi x 6 prior to arrival to ED.

## 2015-04-08 NOTE — ED Provider Notes (Signed)
CSN: [id number]     Arrival date & time 04/20/2015  2128 History  By signing my name below, I, Russell Patton, attest that this documentation has been prepared under the direction and in the presence of Milton Ferguson, MD. Electronically Signed: Julien Nordmann, ED Scribe. 04/12/2015. 9:33 PM.    Chief Complaint  Patient presents with  . Cardiac Arrest    LEVEL 5 CAVEAT DUE TO CONDITION OF THE PATIENT  Patient is a 75 y.o. male presenting with shortness of breath. The history is provided by the EMS personnel (EMS was called because the patient was short of breath. When they got there he was having difficulty breathing shortly after they arrived the patient respiratory arrested. He was intubated immediately. The patient then had episodes of asystole and third-de). The history is limited by the condition of the patient. No language interpreter was used.  Shortness of Breath Severity:  Severe Onset quality:  Sudden Timing:  Constant Progression:  Worsening Chronicity:  Recurrent  HPI Comments: The patient had episodes of third-degree heart block and asystole he was coded for 45 minutes by the paramedics with CPR done significant amount of time Russell Patton is a 75 y.o. male who has a hx of HTN, carotid artery stenosis, CAD, CABG,4, pulmonary fibrosiswas brought in via EMS presents to the Emergency Department for CPR in progress. According to EMS, they were called out at pt's home for shortness of breath. They state that pt was alert and talking to them upon being put into the truck. They note performing CPR intermittently on the pt 45 minutes PTA. EMS worked on pt for about 30 minutes. Pt received 4 epi, 1000 bolus and dopamine by EMS. CO2 of 26 and CBG of 152.  Past Medical History  Diagnosis Date  . Hypertension   . Carotid artery stenosis   . Hyperlipidemia   . Coronary artery disease     CABG x4 2012  . 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 Arthritis   . Smoker     quit  . History of  adenomatous polyp of colon 2005/2008  . Subclavian artery stenosis, right   . Anginal pain ([place])   . Shortness of breath   . Chronic kidney disease   . Pulmonary fibrosis [facility])    Past Surgical History  Procedure Laterality Date  . Appendectomy    . Cervical disc surgery    . Coronary artery bypass graft  01/29/2011    Procedure: CORONARY ARTERY BYPASS GRAFTING (CABG)TIMES FOUR;  Surgeon: Tharon Aquas Trigt III, MD;  Location: [place];  Service: Open Heart Surgery;  Laterality: N/A;  using left internal mammary artery and endoscopically harvested right saphenous vein  . Colonoscopy  01/15/07    normal rectum/cecal/descending colon polyps. adenomatous polyp  . Colonoscopy  10/20/03    normal rectum/sigmoid diverticula, polyps at splenic flexure and at cecum, adenomatous polyp  . Colonoscopy  06/08/2011    Procedure: COLONOSCOPY;  Surgeon: Daneil Dolin, MD;  [facility];  Service: Endoscopy;  Laterality: N/A;  8:30  . Cystoscopy  1970's  . Left heart catheterization with coronary angiogram N/A 01/28/2011    Procedure: LEFT HEART CATHETERIZATION WITH CORONARY ANGIOGRAM;  Surgeon: Troy Sine, MD;  Location: [facility] CATH LAB;  Service: Cardiovascular;  Laterality: N/A;  . Abdominal angiogram N/A 01/28/2011    Procedure: ABDOMINAL ANGIOGRAM;  Surgeon: Troy Sine, MD;  Location: [facility] CATH LAB;  Service: Cardiovascular;  Laterality: N/A;  . Left heart catheterization  with coronary angiogram N/A 06/14/2012    Procedure: LEFT HEART CATHETERIZATION WITH CORONARY ANGIOGRAM;  Surgeon: Pixie Casino, MD;  Location: [facility] CATH LAB;  Service: Cardiovascular;  Laterality: N/A;   Family History  Problem Relation Age of Onset  . Cancer Father 52    colon  . Heart attack Father   . Dementia Mother    Social History  Substance Use Topics  . Smoking status: Former Smoker    Types: Cigarettes    Quit date: 03/22/1999  . Smokeless tobacco: Never Used  . Alcohol Use: 0.0 oz/week    0 Cans of  beer per week     Comment: a beer once every 2-3 weeks, glass of wine 2-3/month    Review of Systems  Unable to perform ROS: Mental status change  Respiratory: Positive for shortness of breath.     LEVEL 5 CAVEAT DUE TO CONDITION OF THE PATIENT  Allergies  Statins  Home Medications   Prior to Admission medications   Medication Sig Start Date End Date Taking? Authorizing Provider  clopidogrel (PLAVIX) 75 MG tablet Take 1 tablet by mouth  daily 09/05/14   Pixie Casino, MD  Glutathione CRYS Inhale 15 mLs into the lungs every other day.     Historical Provider, MD  metoprolol tartrate (LOPRESSOR) 25 MG tablet Take 1 tablet (25mg ) by mouth during day and 1/2 tablet (12.5mg ) by mouth at night.    Historical Provider, MD  metoprolol tartrate (LOPRESSOR) 25 MG tablet Take 1 tablet (25 mg total) by mouth 2 (two) times daily. Need appointment before anymore refills 12/01/14   Pixie Casino, MD  Multiple Vitamin (MULTIVITAMIN WITH MINERALS) TABS Take 1 tablet by mouth daily.    Historical Provider, MD  nitroGLYCERIN (NITROSTAT) 0.4 MG SL tablet Place 1 tablet (0.4 mg total) under the tongue every 5 (five) minutes as needed for chest pain. 05/29/14   Pixie Casino, MD  Pirfenidone (ESBRIET) 267 MG CAPS Take 3 capsules by mouth 3 (three) times daily. 07/02/14   Historical Provider, MD  ZETIA 10 MG tablet Take 1 tablet by mouth  every evening 09/05/14   Pixie Casino, MD   Triage vitals: BP 78/59 mmHg  Pulse 73  Resp 25  SpO2 70% Physical Exam  Constitutional: He appears well-developed.  HENT:  Head: Normocephalic.  Eyes: Conjunctivae and EOM are normal. No scleral icterus.  Patient pupils were midsized but unreactive to light  Neck: Neck supple. No thyromegaly present.  Cardiovascular: Normal rate and regular rhythm.  Exam reveals no gallop and no friction rub.   No murmur heard. Pulmonary/Chest: No stridor. He has no wheezes. He has no rales. He exhibits no tenderness.  Patient was  intubated and breath sounds are normal on both sides  Abdominal: He exhibits no distension. There is no rebound.  Musculoskeletal: He exhibits no edema.  Lymphadenopathy:    He has no cervical adenopathy.  Neurological: He exhibits normal muscle tone. Coordination normal.  Patient would not respond to verbal or painful stimuli  Skin:  Skin mottled  Nursing note and vitals reviewed.   ED Course  Procedures  DIAGNOSTIC STUDIES: Oxygen Saturation is 70% on RA, low by my interpretation.  COORDINATION OF CARE:  9:40 PM Discussed treatment plan which includes imaging and DG chest portable with nursing staff at bedside and they agreed to plan.  Labs Review Labs Reviewed - No data to display  Imaging Review No results found. I have personally reviewed and evaluated  these images and lab results as part of my medical decision-making.   EKG Interpretation   Date/Time:  Wednesday April 08 2015 21:32:30 EST Ventricular Rate:  85 PR Interval:  168 QRS Duration: 154 QT Interval:  389 QTC Calculation: 463 R Axis:   -109 Text Interpretation:  Sinus rhythm RBBB and LAFB ED PHYSICIAN  INTERPRETATION AVAILABLE IN [facility] Confirmed by TEST, Record  ([id number]) on 05-09-15 6:36:45 AM     CRITICAL CARE Performed by: Omaree Fuqua L Total critical care time: 40 minutes Critical care time was exclusive of separately billable procedures and treating other patients. Critical care was necessary to treat or prevent imminent or life-threatening deterioration. Critical care was time spent personally by me on the following activities: development of treatment plan with patient and/or surrogate as well as nursing, discussions with consultants, evaluation of patient's response to treatment, examination of patient, obtaining history from patient or surrogate, ordering and performing treatments and interventions, ordering and review of laboratory studies, ordering and review of radiographic studies,  pulse oximetry and re-evaluation of patient's condition.  Pt was pronounced dead at 12:02 am.   Dr. Gerarda Fraction to do death certificate MDM   Final diagnoses:  None    When the patient arrived in the emergency department he had a blood pressure approximately 80 systolic once on dopamine at the time. His blood pressure increased to 90 on some fluids. But he never regained any type of neurological response. I spoke with his wife about the situation. She stated that he did not want to be resuscitated he did not want to be on a respirator. He had living will that stated he wanted to be a DO NOT RESUSCITATE. I spoke with the critical care doctor who agreed that comfort care was appropriate for this patient. After the wife saw the patient on the ventilator she stated that it was his wishes not to be on the ventilator and she would like to remove the ventilator. Considering the patient's past medical history and his wishes it was felt this was appropriate thing to do for the patient. The patient had the ET tube removed and a few minutes later he was pronounced dead at 12:02 AM. Dr. Riley Kill was notified and will sign the death certificate   Milton Ferguson, MD 04/10/15 1156

## 2015-04-08 NOTE — Procedures (Signed)
Extubation Procedure Note  Patient Details:   Name: Russell Patton DOB: 06/19/1940 MRN: [id number]   Airway Documentation:  Airway 7.5 mm (Active)  Secured at (cm) 24 cm 03/25/2015  9:50 PM  Measured From Lips 04/03/2015  9:50 PM  Secured Location Right 04/07/2015  9:50 PM  Secured By [employer] 04/19/2015  9:50 PM  Tube Holder Repositioned Yes 04/12/2015  9:50 PM  Site Condition Dry 04/02/2015  9:50 PM    Evaluation  O2 sats: currently acceptable Complications: No apparent complications Patient did tolerate procedure well. Bilateral Breath Sounds: Diminished Suctioning: Oral, Airway No   Patient being terminally extubated per Dr. Roderic Palau.  Donata Clay 03/23/2015, 11:48 PM

## 2015-04-13 LAB — CULTURE, BLOOD (ROUTINE X 2)
CULTURE: NO GROWTH
Culture: NO GROWTH

## 2015-04-22 NOTE — ED Notes (Signed)
MD Zammit called TOD at [facility]

## 2015-04-22 DEATH — deceased
